# Patient Record
Sex: Female | Born: 1980 | Race: Black or African American | Hispanic: No | Marital: Single | Smoking: Current every day smoker
Health system: Southern US, Community
[De-identification: ages and names within clinical notes are randomized; demographics above are authoritative.]

## PROBLEM LIST (undated history)

## (undated) DIAGNOSIS — N809 Endometriosis, unspecified: Secondary | ICD-10-CM

## (undated) DIAGNOSIS — I10 Essential (primary) hypertension: Secondary | ICD-10-CM

## (undated) HISTORY — DX: Essential (primary) hypertension: I10

## (undated) HISTORY — PX: DILATION AND CURETTAGE OF UTERUS: SHX78

## (undated) HISTORY — DX: Endometriosis, unspecified: N80.9

## (undated) HISTORY — PX: TUBAL LIGATION: SHX77

---

## 2001-01-27 ENCOUNTER — Emergency Department (HOSPITAL_COMMUNITY): Admission: EM | Admit: 2001-01-27 | Discharge: 2001-01-27 | Payer: Self-pay | Admitting: *Deleted

## 2001-01-27 ENCOUNTER — Emergency Department (HOSPITAL_COMMUNITY): Admission: EM | Admit: 2001-01-27 | Discharge: 2001-01-27 | Payer: Self-pay | Admitting: Emergency Medicine

## 2001-05-02 ENCOUNTER — Emergency Department (HOSPITAL_COMMUNITY): Admission: EM | Admit: 2001-05-02 | Discharge: 2001-05-02 | Payer: Self-pay | Admitting: Emergency Medicine

## 2001-05-14 ENCOUNTER — Emergency Department (HOSPITAL_COMMUNITY): Admission: EM | Admit: 2001-05-14 | Discharge: 2001-05-14 | Payer: Self-pay | Admitting: *Deleted

## 2001-11-20 ENCOUNTER — Encounter: Payer: Self-pay | Admitting: Obstetrics and Gynecology

## 2001-11-20 ENCOUNTER — Emergency Department (HOSPITAL_COMMUNITY): Admission: EM | Admit: 2001-11-20 | Discharge: 2001-11-20 | Payer: Self-pay | Admitting: Emergency Medicine

## 2002-04-25 ENCOUNTER — Emergency Department (HOSPITAL_COMMUNITY): Admission: EM | Admit: 2002-04-25 | Discharge: 2002-04-26 | Payer: Self-pay | Admitting: *Deleted

## 2002-06-16 ENCOUNTER — Emergency Department (HOSPITAL_COMMUNITY): Admission: EM | Admit: 2002-06-16 | Discharge: 2002-06-16 | Payer: Self-pay | Admitting: Internal Medicine

## 2002-09-02 ENCOUNTER — Emergency Department (HOSPITAL_COMMUNITY): Admission: EM | Admit: 2002-09-02 | Discharge: 2002-09-03 | Payer: Self-pay | Admitting: *Deleted

## 2002-09-03 ENCOUNTER — Encounter: Payer: Self-pay | Admitting: *Deleted

## 2003-10-03 ENCOUNTER — Emergency Department (HOSPITAL_COMMUNITY): Admission: EM | Admit: 2003-10-03 | Discharge: 2003-10-03 | Payer: Self-pay | Admitting: Emergency Medicine

## 2004-04-10 ENCOUNTER — Emergency Department (HOSPITAL_COMMUNITY): Admission: EM | Admit: 2004-04-10 | Discharge: 2004-04-11 | Payer: Self-pay | Admitting: Emergency Medicine

## 2004-04-13 ENCOUNTER — Emergency Department (HOSPITAL_COMMUNITY): Admission: EM | Admit: 2004-04-13 | Discharge: 2004-04-13 | Payer: Self-pay | Admitting: Emergency Medicine

## 2004-07-24 ENCOUNTER — Emergency Department (HOSPITAL_COMMUNITY): Admission: EM | Admit: 2004-07-24 | Discharge: 2004-07-25 | Payer: Self-pay | Admitting: *Deleted

## 2004-12-10 ENCOUNTER — Emergency Department (HOSPITAL_COMMUNITY): Admission: EM | Admit: 2004-12-10 | Discharge: 2004-12-10 | Payer: Self-pay | Admitting: *Deleted

## 2005-01-22 ENCOUNTER — Emergency Department (HOSPITAL_COMMUNITY): Admission: EM | Admit: 2005-01-22 | Discharge: 2005-01-23 | Payer: Self-pay | Admitting: Emergency Medicine

## 2005-05-31 ENCOUNTER — Ambulatory Visit (HOSPITAL_COMMUNITY): Admission: AD | Admit: 2005-05-31 | Discharge: 2005-05-31 | Payer: Self-pay | Admitting: Obstetrics and Gynecology

## 2005-06-28 ENCOUNTER — Ambulatory Visit (HOSPITAL_COMMUNITY): Admission: AD | Admit: 2005-06-28 | Discharge: 2005-06-28 | Payer: Self-pay | Admitting: Obstetrics and Gynecology

## 2005-07-25 ENCOUNTER — Encounter: Payer: Self-pay | Admitting: Obstetrics and Gynecology

## 2005-07-25 ENCOUNTER — Inpatient Hospital Stay (HOSPITAL_COMMUNITY): Admission: RE | Admit: 2005-07-25 | Discharge: 2005-07-27 | Payer: Self-pay | Admitting: Obstetrics and Gynecology

## 2005-12-17 ENCOUNTER — Emergency Department (HOSPITAL_COMMUNITY): Admission: EM | Admit: 2005-12-17 | Discharge: 2005-12-17 | Payer: Self-pay | Admitting: Emergency Medicine

## 2007-12-02 ENCOUNTER — Emergency Department (HOSPITAL_COMMUNITY): Admission: EM | Admit: 2007-12-02 | Discharge: 2007-12-02 | Payer: Self-pay | Admitting: Emergency Medicine

## 2010-10-12 NOTE — Discharge Summary (Signed)
[name], [name]           ACCOUNT NO.:  [id number]   MEDICAL RECORD NO.:  [id number]          PATIENT TYPE:  INP   LOCATION:  A413                          FACILITY:  [facility]   PHYSICIAN:  Tilda Burrow, M.D. DATE OF BIRTH:  [birth year]   DATE OF ADMISSION:  07/25/2005  DATE OF DISCHARGE:  03/03/2007LH                                 DISCHARGE SUMMARY   ADMITTING DIAGNOSES:  1.  Pregnancy at 39+ weeks gestation.  2.  Repeat cesarean section, not for trial of labor.  3.  Elective permanent sterilization.   POSTOPERATIVE DIAGNOSES:  1.  Pregnancy at 39+ weeks gestation.  2.  Repeat cesarean section, not for trial of labor.  3.  Elective permanent sterilization.   PROCEDURE:  03/102/007, repeat low transverse cervical cesarean section,  bilateral partial salpingectomy.   DISCHARGE MEDICATIONS:  1.  Tylox #30 tablets, one q.4h. p.r.n. pain.  2.  Chromagen Forte #60 tablets, one twice daily x 30 days.   FOLLOWUP:  Follow up in one week for staple removal.   HOSPITAL SUMMARY:  This 30 year old female, gravida 4, para 1, AB 2, was  admitted at 39+ weeks gestation for repeat cesarean section.   HOSPITAL COURSE:  She underwent a repeat cesarean section and tubal ligation  on 07/25/2005.  She underwent repeat cesarean section delivering a healthy  infant, Apgars 8 and 9, with 500 to 600 cc of blood loss.  Tubal  sterilization was performed, as well.  The pathology report confirmed  presence of portions of both tubes were removed.   Postoperative laboratory assessment includes hemoglobin 10.5, hematocrit  31.7 compared to preop hemoglobin of 11.3 and hematocrit 33.5.  Maternal  blood type confirmed as O positive.   The patient had a straightforward postoperative course.  She tolerated  regular diet and had prescription for Tylox written for discharge.  She was  seen by Dr. Lisette Grinder the second postoperative day prior to discharge and  confirmed as well being for followup in  one week in our office to discuss  circumcision and remove staples.      Tilda Burrow, M.D.  Electronically Signed     JVF/MEDQ  D:  09/22/2005  T:  09/23/2005  Job:  [id number]

## 2010-10-12 NOTE — H&P (Signed)
[name], [name]           ACCOUNT NO.:  [id number]   MEDICAL RECORD NO.:  [id number]          PATIENT TYPE:  AMB   LOCATION:  DAY                           FACILITY:  [facility]   PHYSICIAN:  Tilda Burrow, M.D. DATE OF BIRTH:  Jun 03, 1980   DATE OF ADMISSION:  07/25/2005  DATE OF DISCHARGE:  LH                                HISTORY & PHYSICAL   ADMISSION DIAGNOSES:  1.  Pregnancy at 39+ weeks gestation.  2.  Repeat cesarean section, not for trial of labor.  3.  Elective permanent sterilization.   HISTORY OF PRESENT ILLNESS:  This 30 year old gravida 4, para 1, AB 2, 1  living child currently at 39+ weeks gestation is admitted after pregnancy  course was followed through our office through 13 prenatal visits.  She  desires repeat cesarean section and tubal ligation.   PRENATAL LABORATORY DATA:  Include blood type A positive.  Urine drug screen  negative.  Rubella immunity present.  Hemoglobin 11, hematocrit 35.  Hepatitis, HIV, RPR, GC, and chlamydia all negative.  She has HSV-2 antibody  status positive with patient declining MSAFP at initiation of pregnancy.   She desires to proceed with cesarean delivery.  Father of the baby, [name].  This is his first child.   PAST MEDICAL HISTORY:  Benign.   PAST SURGICAL HISTORY:  1.  D&C.  2.  Cesarean section.   ALLERGIES:  None.   SOCIAL HISTORY:  Works at [employer]. [name].  Lives with daughter.   PHYSICAL EXAMINATION:  VITAL SIGNS:  Height 5 feet 5 inches, 198 pounds.  Blood pressure 98/50.  GENERAL: Healthy female.  PELVIC: Vertex presentation with well-healed Pfannenstiel incision.   PLAN:  Repeat caesarean section and tubal ligation on July 25, 2005.      Tilda Burrow, M.D.  Electronically Signed     JVF/MEDQ  D:  07/24/2005  T:  07/24/2005  Job:  [id number]   cc:   [facility]   [name]. Savannah Cage DO, FAAP  Fax: [phone]

## 2010-10-12 NOTE — Op Note (Signed)
NAMESUBRENA, DEVEREUX           ACCOUNT NO.:  [id number]   MEDICAL RECORD NO.:  [id number]          PATIENT TYPE:  INP   LOCATION:  A413                          FACILITY:  [facility]   PHYSICIAN:  Tilda Burrow, M.D. DATE OF BIRTH:  [birth year]   DATE OF PROCEDURE:  07/25/2005  DATE OF DISCHARGE:                                 OPERATIVE REPORT   PREOPERATIVE DIAGNOSIS:  Pregnancy, 39 weeks. Repeat Cesarean section. Not  for trial of labor. Elective permanent sterilization.   POSTOPERATIVE DIAGNOSIS:  Pregnancy, 39 weeks. Repeat Cesarean section. Not  for trial of labor. Elective permanent sterilization.   PROCEDURE:  Laparoscopic tubal sterilization with Falope rings.   SURGEON:  Tilda Burrow, M.D.   ASSISTANT:  Morrie Sheldon, R.N.   ANESTHESIA:  Spinal, Nelda Severe, C.R.N.A.   COMPLICATIONS:  None.   FINDINGS:  Healthy female infant, Apgars 9 and 9,   DESCRIPTION OF PROCEDURE:  The patient was taken to the operating room,  prepped and draped for lower abdominal surgery with a Foley catheter in  place. Lower abdominal incision was repeated with excision of the cicatrix  and adjacent 1 cm skin on either side. Standard Pfannenstiel peritoneal  cavity opening was performed. Some omental adhesions of the anterior  abdominal wall were identified and resected.   Bladder was placed to retract the bladder inferiorly, bladder flap easily  developed on the lower uterine segment with transverse uterine incision in  the lower uterine segment performed, extended laterally using index finger  traction. Fetal vertex was guided out using vacuum extractor guidance  combined with fundal pressure. Infant cried promptly and was placed in the  care of Dr. Milford Cage; see his notes for further details. Apgars 8 and 9 were  assigned.   Cord blood samples were obtained and placenta delivered from its fundal  position with crede massage to the uterus. EBL very low, no more than 500 to  600 cc. The uterus was  irrigated with antibiotic solution, closed in single  layer of running locking closure with 2-0 chromic closure of the bladder  flap.   Tubal ligation:  Tubal ligation performed by grasping each tube in the mid  segment, doubly ligating around the mid segment knuckle of tube, excising  the incarcerated portion of tube as a surgical specimen and then confirming  hemostasis. Anterior peritoneum was closed with continuous running 2-0  chromic. Rectus muscles pulled together  with interrupted 2-0 chromic. The fascia was closed with running 0 Vicryl.  Subcutaneous tissues were mobilized inferiorly to allow good skin edge  reapproximation. Two-0 plain was used to reapproximated subcu fatty tissues.  Staple closure of the skin completed the procedure. EBL as listed  previously; I think it was 600.      Tilda Burrow, M.D.  Electronically Signed     JVF/MEDQ  D:  07/25/2005  T:  07/25/2005  Job:  [id number]   cc:   Francoise Schaumann. Milford Cage DO, FAAP  Fax: [phone]

## 2010-10-12 NOTE — Consult Note (Signed)
[name], [name]           ACCOUNT NO.:  [id number]   MEDICAL RECORD NO.:  [id number]          PATIENT TYPE:  OIB   LOCATION:  A415                          FACILITY:  [facility]   PHYSICIAN:  Tilda Burrow, M.D. DATE OF BIRTH:  Dec 14, 1980   DATE OF CONSULTATION:  DATE OF DISCHARGE:  05/31/2005                                   CONSULTATION   OBSERVATION NOTE:   CHIEF COMPLAINT:  Sharp stomach pains.   HISTORY OF PRESENT ILLNESS:  This [redacted] weeks gestation multiparous patient  presents at approximately 4 p.m. complaining of stomach pain and pressure  alert, awake, oriented x3.  When at work she notices abdominal discomfort.  She additionally has a history of nausea and vomiting in the mornings.  She  feels pressure sensation after eating and eating and is unable to sleep due  to the heavy stomach.   PHYSICAL EXAMINATION:  Assessment includes a blood pressure 115/55, pulse  100, no evidence of bleeding.  Reflexes 2+.  The patient is evaluated at  bedside by Jannifer Franklin.   IMPRESSION:  Clinical impression is that the patient simply has pelvic  pressure related to third trimester.  She is therefore discharged home for  followup June 03, 2005 in the office.  She is given a note for work and  symptoms of preterm labor reviewed.   Observation time less than 1 hour.      Tilda Burrow, M.D.  Electronically Signed     JVF/MEDQ  D:  06/26/2005  T:  06/26/2005  Job:  [id number]

## 2011-02-21 LAB — POCT I-STAT, CHEM 8
Chloride: 106
Glucose, Bld: 91
HCT: 43
Hemoglobin: 14.6
Potassium: 4.2
Sodium: 137

## 2011-03-24 ENCOUNTER — Emergency Department (HOSPITAL_COMMUNITY): Payer: Medicaid Other

## 2011-03-24 ENCOUNTER — Emergency Department (HOSPITAL_COMMUNITY)
Admission: EM | Admit: 2011-03-24 | Discharge: 2011-03-24 | Disposition: A | Discharge status: Home / Self Care | Payer: Medicaid Other | Attending: Emergency Medicine | Admitting: Emergency Medicine

## 2011-03-24 DIAGNOSIS — R079 Chest pain, unspecified: Secondary | ICD-10-CM | POA: Insufficient documentation

## 2011-03-24 DIAGNOSIS — S20219A Contusion of unspecified front wall of thorax, initial encounter: Secondary | ICD-10-CM | POA: Insufficient documentation

## 2011-04-10 ENCOUNTER — Emergency Department (INDEPENDENT_AMBULATORY_CARE_PROVIDER_SITE_OTHER)
Admission: EM | Admit: 2011-04-10 | Discharge: 2011-04-10 | Disposition: A | Discharge status: Home / Self Care | Payer: Medicaid Other | Source: Home / Self Care | Attending: Family Medicine | Admitting: Family Medicine

## 2011-04-10 ENCOUNTER — Encounter: Payer: Self-pay | Admitting: *Deleted

## 2011-04-10 DIAGNOSIS — K089 Disorder of teeth and supporting structures, unspecified: Secondary | ICD-10-CM

## 2011-04-10 DIAGNOSIS — K0889 Other specified disorders of teeth and supporting structures: Secondary | ICD-10-CM

## 2011-04-10 MED ORDER — DICLOFENAC POTASSIUM 50 MG PO TABS: 50.0000 mg | ORAL_TABLET | Freq: Three times a day (TID) | ORAL | Status: DC

## 2011-04-10 NOTE — ED Provider Notes (Signed)
History     CSN: [id number] Arrival date & time: 04/10/2011  4:27 PM   First MD Initiated Contact with Patient 04/10/11 1651      Chief Complaint  Patient presents with  . Dental Pain    (Consider location/radiation/quality/duration/timing/severity/associated sxs/prior treatment) Patient is a 30 y.o. female presenting with tooth pain. The history is provided by the patient.  Dental PainThe symptoms began more than 1 week ago (s/p wisdom teeth extraction on 11/6 , took all of abx prescribed.. has appt on thurs.). The symptoms are unchanged.  Additional symptoms include: facial swelling. Additional symptoms do not include: trouble swallowing.    History reviewed. No pertinent past medical history.  Past Surgical History  Procedure Date  . Dilation and curettage of uterus     History reviewed. No pertinent family history.  History  Substance Use Topics  . Smoking status: Current Everyday Smoker  . Smokeless tobacco: Not on file  . Alcohol Use: No    OB History    Grav Para Term Preterm Abortions TAB SAB Ect Mult Living                  Review of Systems  Constitutional: Negative.   HENT: Positive for facial swelling and dental problem. Negative for trouble swallowing and voice change.     Allergies  Review of patient's allergies indicates no known allergies.  Home Medications   Current Outpatient Rx  Name Route Sig Dispense Refill  . IBUPROFEN 200 MG PO TABS Oral Take 200 mg by mouth every 6 (six) hours as needed.        BP 129/80  Pulse 85  Temp(Src) 98.3 F (36.8 C) (Oral)  Resp 18  SpO2 98%  LMP 02/24/2011  Physical Exam  Constitutional: She appears well-developed and well-nourished.  HENT:  Head: Normocephalic and atraumatic.  Right Ear: External ear normal.  Left Ear: External ear normal.  Nose: Nose normal.  Mouth/Throat: Uvula is midline, oropharynx is clear and moist and mucous membranes are normal. Abnormal dentition. Dental abscesses and  dental caries present. No uvula swelling.  Neck: Normal range of motion. Neck supple.  Lymphadenopathy:    She has no cervical adenopathy.    ED Course  Procedures (including critical care time)  Labs Reviewed - No data to display No results found.   No diagnosis found.    MDM          Barkley Bruns, MD 04/10/11 [phone]

## 2011-04-10 NOTE — ED Notes (Signed)
Toothache   -  Had  Teeth  Extracted      6  Nov    Has  An appt  With  Dentist  tommorow  Pain not  releived  By  tylenol

## 2011-12-04 ENCOUNTER — Encounter (HOSPITAL_COMMUNITY): Payer: Self-pay | Admitting: Emergency Medicine

## 2011-12-04 LAB — CBC WITH DIFFERENTIAL/PLATELET
Eosinophils Relative: 2 % (ref 0–5)
HCT: 39.5 % (ref 36.0–46.0)
Lymphocytes Relative: 29 % (ref 12–46)
Lymphs Abs: 4.1 10*3/uL — ABNORMAL HIGH (ref 0.7–4.0)
MCHC: 33.7 g/dL (ref 30.0–36.0)
Monocytes Absolute: 0.9 10*3/uL (ref 0.1–1.0)
Monocytes Relative: 6 % (ref 3–12)
Platelets: 262 10*3/uL (ref 150–400)
RBC: 4.54 MIL/uL (ref 3.87–5.11)
RDW: 13.7 % (ref 11.5–15.5)

## 2011-12-04 LAB — URINE MICROSCOPIC-ADD ON

## 2011-12-04 LAB — URINALYSIS, ROUTINE W REFLEX MICROSCOPIC
Glucose, UA: NEGATIVE mg/dL
Ketones, ur: NEGATIVE mg/dL
Nitrite: NEGATIVE
Protein, ur: NEGATIVE mg/dL

## 2011-12-04 MED ORDER — ONDANSETRON 4 MG PO TBDP
ORAL_TABLET | ORAL | Status: AC
  Filled 2011-12-04: qty 1

## 2011-12-04 MED ORDER — ONDANSETRON 4 MG PO TBDP
4.0000 mg | ORAL_TABLET | Freq: Once | ORAL | Status: AC
  Administered 2011-12-04: 4 mg via ORAL

## 2011-12-04 NOTE — ED Notes (Signed)
Pt continues with dry heaves

## 2011-12-04 NOTE — ED Notes (Addendum)
Error in charting.

## 2011-12-04 NOTE — ED Notes (Signed)
Pt stating she is going to leave b/c the pain is too much.  Charge RN notified and stated we are unable to open a room for her at this time.

## 2011-12-04 NOTE — ED Notes (Signed)
The pt is c/o epigastric pain with nausea  No diarrhea.  Urinary frequency with  Bloody urine lmp  3 months ago  Tubal lig

## 2011-12-04 NOTE — ED Notes (Addendum)
Disregard all charting by EP: charted under wrong pt. Zofran not given to this pt

## 2011-12-04 NOTE — ED Notes (Signed)
Pt states she is leaving.  Encouraged to stay.  Stating she is leaving.

## 2011-12-05 ENCOUNTER — Inpatient Hospital Stay (HOSPITAL_COMMUNITY): Payer: Medicaid Other | Admitting: Anesthesiology

## 2011-12-05 ENCOUNTER — Inpatient Hospital Stay (HOSPITAL_COMMUNITY)
Admission: EM | Admit: 2011-12-05 | Discharge: 2011-12-06 | DRG: 340 | Disposition: A | Discharge status: Home / Self Care | Payer: Medicaid Other | Attending: Surgery | Admitting: Surgery

## 2011-12-05 ENCOUNTER — Emergency Department (HOSPITAL_COMMUNITY)
Admission: EM | Admit: 2011-12-05 | Discharge: 2011-12-05 | Disposition: A | Discharge status: Home / Self Care | Payer: Medicaid Other | Source: Home / Self Care

## 2011-12-05 ENCOUNTER — Inpatient Hospital Stay: Admit: 2011-12-05 | Payer: Self-pay | Admitting: Surgery

## 2011-12-05 ENCOUNTER — Emergency Department (HOSPITAL_COMMUNITY): Payer: Medicaid Other

## 2011-12-05 ENCOUNTER — Encounter (HOSPITAL_COMMUNITY)
Admission: EM | Disposition: A | Discharge status: Home / Self Care | Payer: Self-pay | Source: Home / Self Care | Attending: Surgery

## 2011-12-05 ENCOUNTER — Encounter (HOSPITAL_COMMUNITY): Payer: Self-pay

## 2011-12-05 ENCOUNTER — Encounter (HOSPITAL_COMMUNITY): Payer: Self-pay | Admitting: Anesthesiology

## 2011-12-05 DIAGNOSIS — K3580 Unspecified acute appendicitis: Secondary | ICD-10-CM | POA: Diagnosis present

## 2011-12-05 DIAGNOSIS — K3533 Acute appendicitis with perforation and localized peritonitis, with abscess: Principal | ICD-10-CM | POA: Diagnosis present

## 2011-12-05 HISTORY — PX: LAPAROSCOPIC APPENDECTOMY: SHX408

## 2011-12-05 HISTORY — DX: Essential (primary) hypertension: I10

## 2011-12-05 LAB — COMPREHENSIVE METABOLIC PANEL
ALT: 27 U/L (ref 0–35)
AST: 17 U/L (ref 0–37)
Albumin: 4.1 g/dL (ref 3.5–5.2)
Albumin: 4.4 g/dL (ref 3.5–5.2)
Alkaline Phosphatase: 67 U/L (ref 39–117)
BUN: 10 mg/dL (ref 6–23)
CO2: 21 mEq/L (ref 19–32)
Calcium: 9.3 mg/dL (ref 8.4–10.5)
Calcium: 9.6 mg/dL (ref 8.4–10.5)
Chloride: 102 mEq/L (ref 96–112)
GFR calc Af Amer: >90 mL/min (ref >90)
GFR calc non Af Amer: >90 mL/min (ref >90)
Glucose, Bld: 91 mg/dL (ref 70–99)
Potassium: 3.2 mEq/L — ABNORMAL LOW (ref 3.5–5.1)
Sodium: 139 mEq/L (ref 135–145)
Total Bilirubin: 0.3 mg/dL (ref 0.3–1.2)
Total Bilirubin: 0.4 mg/dL (ref 0.3–1.2)
Total Protein: 7.7 g/dL (ref 6.0–8.3)
Total Protein: 8 g/dL (ref 6.0–8.3)

## 2011-12-05 LAB — URINE MICROSCOPIC-ADD ON

## 2011-12-05 LAB — CBC
Hemoglobin: 13.7 g/dL (ref 12.0–15.0)
Platelets: 301 10*3/uL (ref 150–400)
RBC: 4.72 MIL/uL (ref 3.87–5.11)
RDW: 13.3 % (ref 11.5–15.5)
WBC: 14.7 10*3/uL — ABNORMAL HIGH (ref 4.0–10.5)

## 2011-12-05 LAB — URINALYSIS, ROUTINE W REFLEX MICROSCOPIC
Bilirubin Urine: NEGATIVE
Hgb urine dipstick: NEGATIVE
Nitrite: NEGATIVE
Specific Gravity, Urine: 1.027 (ref 1.005–1.030)
pH: 7.5 (ref 5.0–8.0)

## 2011-12-05 LAB — PREGNANCY, URINE: Preg Test, Ur: NEGATIVE

## 2011-12-05 SURGERY — APPENDECTOMY, LAPAROSCOPIC
Anesthesia: General | Site: Abdomen | Wound class: Dirty or Infected

## 2011-12-05 MED ORDER — LACTATED RINGERS IV SOLN
INTRAVENOUS | Status: DC | PRN
  Administered 2011-12-05 (×2): via INTRAVENOUS

## 2011-12-05 MED ORDER — FENTANYL CITRATE 0.05 MG/ML IJ SOLN
INTRAMUSCULAR | Status: AC
  Filled 2011-12-05: qty 2

## 2011-12-05 MED ORDER — DIPHENHYDRAMINE HCL 50 MG/ML IJ SOLN: 12.5000 mg | Freq: Four times a day (QID) | INTRAMUSCULAR | Status: DC | PRN

## 2011-12-05 MED ORDER — MORPHINE SULFATE 4 MG/ML IJ SOLN
4.0000 mg | Freq: Once | INTRAMUSCULAR | Status: AC
  Administered 2011-12-05: 4 mg via INTRAVENOUS
  Filled 2011-12-05: qty 1

## 2011-12-05 MED ORDER — MORPHINE SULFATE 2 MG/ML IJ SOLN
1.0000 mg | INTRAMUSCULAR | Status: DC | PRN
  Administered 2011-12-05: 4 mg via INTRAVENOUS
  Administered 2011-12-05 – 2011-12-06 (×3): 2 mg via INTRAVENOUS
  Filled 2011-12-05: qty 2
  Filled 2011-12-05 (×4): qty 1

## 2011-12-05 MED ORDER — LACTATED RINGERS IV SOLN
INTRAVENOUS | Status: DC
  Administered 2011-12-05 – 2011-12-06 (×3): via INTRAVENOUS

## 2011-12-05 MED ORDER — 0.9 % SODIUM CHLORIDE (POUR BTL) OPTIME
TOPICAL | Status: DC | PRN
  Administered 2011-12-05: 1000 mL

## 2011-12-05 MED ORDER — AMOXICILLIN-POT CLAVULANATE 875-125 MG PO TABS: 1.0000 | ORAL_TABLET | Freq: Two times a day (BID) | ORAL | Status: DC

## 2011-12-05 MED ORDER — SODIUM CHLORIDE 0.9 % IJ SOLN
3.0000 mL | Freq: Two times a day (BID) | INTRAMUSCULAR | Status: DC
  Administered 2011-12-05 – 2011-12-06 (×2): 3 mL via INTRAVENOUS

## 2011-12-05 MED ORDER — METRONIDAZOLE IN NACL 5-0.79 MG/ML-% IV SOLN
500.0000 mg | Freq: Three times a day (TID) | INTRAVENOUS | Status: DC
  Administered 2011-12-05 – 2011-12-06 (×4): 500 mg via INTRAVENOUS
  Filled 2011-12-05 (×4): qty 100

## 2011-12-05 MED ORDER — ACETAMINOPHEN 650 MG RE SUPP: 650.0000 mg | Freq: Four times a day (QID) | RECTAL | Status: DC | PRN

## 2011-12-05 MED ORDER — NAPROXEN 500 MG PO TABS
500.0000 mg | ORAL_TABLET | Freq: Two times a day (BID) | ORAL | Status: DC
  Administered 2011-12-05 – 2011-12-06 (×2): 500 mg via ORAL
  Filled 2011-12-05 (×4): qty 1

## 2011-12-05 MED ORDER — ONDANSETRON HCL 4 MG/2ML IJ SOLN
INTRAMUSCULAR | Status: DC | PRN
  Administered 2011-12-05: 4 mg via INTRAVENOUS

## 2011-12-05 MED ORDER — LIP MEDEX EX OINT
1.0000 "application " | TOPICAL_OINTMENT | Freq: Two times a day (BID) | CUTANEOUS | Status: DC
  Administered 2011-12-05 – 2011-12-06 (×2): 1 via TOPICAL
  Filled 2011-12-05: qty 7

## 2011-12-05 MED ORDER — DEXAMETHASONE SODIUM PHOSPHATE 10 MG/ML IJ SOLN
INTRAMUSCULAR | Status: DC | PRN
  Administered 2011-12-05: 10 mg via INTRAVENOUS

## 2011-12-05 MED ORDER — NEOSTIGMINE METHYLSULFATE 1 MG/ML IJ SOLN
INTRAMUSCULAR | Status: DC | PRN
  Administered 2011-12-05: 3 mg via INTRAVENOUS

## 2011-12-05 MED ORDER — MORPHINE SULFATE 4 MG/ML IJ SOLN
6.0000 mg | Freq: Once | INTRAMUSCULAR | Status: AC
  Administered 2011-12-05: 6 mg via INTRAVENOUS
  Filled 2011-12-05: qty 2

## 2011-12-05 MED ORDER — METRONIDAZOLE IN NACL 5-0.79 MG/ML-% IV SOLN
INTRAVENOUS | Status: AC
  Filled 2011-12-05: qty 100

## 2011-12-05 MED ORDER — BUPIVACAINE-EPINEPHRINE PF 0.25-1:200000 % IJ SOLN
INTRAMUSCULAR | Status: DC | PRN
  Administered 2011-12-05: 60 mL

## 2011-12-05 MED ORDER — BISACODYL 10 MG RE SUPP: 10.0000 mg | Freq: Two times a day (BID) | RECTAL | Status: DC | PRN

## 2011-12-05 MED ORDER — OXYCODONE HCL 5 MG PO TABS: 5.0000 mg | ORAL_TABLET | ORAL | Status: AC | PRN

## 2011-12-05 MED ORDER — LACTATED RINGERS IV BOLUS (SEPSIS): 1000.0000 mL | Freq: Three times a day (TID) | INTRAVENOUS | Status: DC | PRN

## 2011-12-05 MED ORDER — SUCCINYLCHOLINE CHLORIDE 20 MG/ML IJ SOLN
INTRAMUSCULAR | Status: DC | PRN
  Administered 2011-12-05: 100 mg via INTRAVENOUS

## 2011-12-05 MED ORDER — KETOROLAC TROMETHAMINE 30 MG/ML IJ SOLN
INTRAMUSCULAR | Status: DC | PRN
  Administered 2011-12-05: 30 mg via INTRAVENOUS

## 2011-12-05 MED ORDER — ACETAMINOPHEN 10 MG/ML IV SOLN
INTRAVENOUS | Status: AC
  Filled 2011-12-05: qty 100

## 2011-12-05 MED ORDER — IOHEXOL 300 MG/ML  SOLN
100.0000 mL | Freq: Once | INTRAMUSCULAR | Status: AC | PRN
  Administered 2011-12-05: 100 mL via INTRAVENOUS

## 2011-12-05 MED ORDER — MAGNESIUM HYDROXIDE 400 MG/5ML PO SUSP: 30.0000 mL | Freq: Two times a day (BID) | ORAL | Status: DC | PRN

## 2011-12-05 MED ORDER — PROMETHAZINE HCL 25 MG/ML IJ SOLN: 12.5000 mg | Freq: Four times a day (QID) | INTRAMUSCULAR | Status: DC | PRN

## 2011-12-05 MED ORDER — SUFENTANIL CITRATE 50 MCG/ML IV SOLN
INTRAVENOUS | Status: DC | PRN
  Administered 2011-12-05: 10 ug via INTRAVENOUS
  Administered 2011-12-05: 5 ug via INTRAVENOUS
  Administered 2011-12-05: 10 ug via INTRAVENOUS
  Administered 2011-12-05: 5 ug via INTRAVENOUS

## 2011-12-05 MED ORDER — GLYCOPYRROLATE 0.2 MG/ML IJ SOLN
INTRAMUSCULAR | Status: DC | PRN
  Administered 2011-12-05: 0.6 mg via INTRAVENOUS

## 2011-12-05 MED ORDER — HYDROMORPHONE HCL PF 1 MG/ML IJ SOLN: 0.2500 mg | INTRAMUSCULAR | Status: DC | PRN

## 2011-12-05 MED ORDER — HYDROCODONE-ACETAMINOPHEN 5-325 MG PO TABS
1.0000 | ORAL_TABLET | ORAL | Status: DC | PRN
  Administered 2011-12-05 – 2011-12-06 (×4): 2 via ORAL
  Filled 2011-12-05 (×4): qty 2

## 2011-12-05 MED ORDER — POTASSIUM CHLORIDE 2 MEQ/ML IV SOLN
INTRAVENOUS | Status: DC
  Administered 2011-12-05: 12:00:00 via INTRAVENOUS
  Filled 2011-12-05 (×2): qty 1000

## 2011-12-05 MED ORDER — STERILE WATER FOR IRRIGATION IR SOLN
Status: DC | PRN
  Administered 2011-12-05: 1500 mL

## 2011-12-05 MED ORDER — FENTANYL CITRATE 0.05 MG/ML IJ SOLN
50.0000 ug | INTRAMUSCULAR | Status: AC
  Administered 2011-12-05: 50 ug via INTRAVENOUS
  Filled 2011-12-05: qty 2

## 2011-12-05 MED ORDER — LIDOCAINE HCL (CARDIAC) 20 MG/ML IV SOLN
INTRAVENOUS | Status: DC | PRN
  Administered 2011-12-05: 50 mg via INTRAVENOUS

## 2011-12-05 MED ORDER — SACCHAROMYCES BOULARDII 250 MG PO CAPS
250.0000 mg | ORAL_CAPSULE | Freq: Two times a day (BID) | ORAL | Status: DC
  Administered 2011-12-05 – 2011-12-06 (×2): 250 mg via ORAL
  Filled 2011-12-05 (×3): qty 1

## 2011-12-05 MED ORDER — FENTANYL CITRATE 0.05 MG/ML IJ SOLN
INTRAMUSCULAR | Status: DC | PRN
  Administered 2011-12-05 (×2): 50 ug via INTRAVENOUS

## 2011-12-05 MED ORDER — ONDANSETRON HCL 4 MG/2ML IJ SOLN
4.0000 mg | Freq: Once | INTRAMUSCULAR | Status: AC
  Administered 2011-12-05: 4 mg via INTRAVENOUS
  Filled 2011-12-05: qty 2

## 2011-12-05 MED ORDER — CEFTRIAXONE SODIUM 2 G IJ SOLR
2.0000 g | INTRAMUSCULAR | Status: DC
  Administered 2011-12-05 – 2011-12-06 (×2): 2 g via INTRAVENOUS
  Filled 2011-12-05 (×2): qty 2

## 2011-12-05 MED ORDER — ONDANSETRON HCL 4 MG/2ML IJ SOLN: 4.0000 mg | Freq: Four times a day (QID) | INTRAMUSCULAR | Status: DC | PRN

## 2011-12-05 MED ORDER — ACETAMINOPHEN 325 MG PO TABS
325.0000 mg | ORAL_TABLET | Freq: Four times a day (QID) | ORAL | Status: DC | PRN
  Administered 2011-12-06: 650 mg via ORAL
  Filled 2011-12-05: qty 2

## 2011-12-05 MED ORDER — METOCLOPRAMIDE HCL 5 MG/ML IJ SOLN
INTRAMUSCULAR | Status: DC | PRN
  Administered 2011-12-05 (×2): 5 mg via INTRAVENOUS

## 2011-12-05 MED ORDER — MAGIC MOUTHWASH
15.0000 mL | Freq: Four times a day (QID) | ORAL | Status: DC | PRN
  Filled 2011-12-05: qty 15

## 2011-12-05 MED ORDER — ACETAMINOPHEN 325 MG PO TABS: 650.0000 mg | ORAL_TABLET | Freq: Four times a day (QID) | ORAL | Status: DC | PRN

## 2011-12-05 MED ORDER — ROCURONIUM BROMIDE 100 MG/10ML IV SOLN
INTRAVENOUS | Status: DC | PRN
  Administered 2011-12-05: 40 mg via INTRAVENOUS

## 2011-12-05 MED ORDER — ALUM & MAG HYDROXIDE-SIMETH 200-200-20 MG/5ML PO SUSP: 30.0000 mL | Freq: Four times a day (QID) | ORAL | Status: DC | PRN

## 2011-12-05 MED ORDER — SODIUM CHLORIDE 0.9 % IJ SOLN: 3.0000 mL | INTRAMUSCULAR | Status: DC | PRN

## 2011-12-05 MED ORDER — PROPOFOL 10 MG/ML IV BOLUS
INTRAVENOUS | Status: DC | PRN
  Administered 2011-12-05: 100 mg via INTRAVENOUS

## 2011-12-05 MED ORDER — MIDAZOLAM HCL 5 MG/5ML IJ SOLN
INTRAMUSCULAR | Status: DC | PRN
  Administered 2011-12-05 (×2): 1 mg via INTRAVENOUS

## 2011-12-05 MED ORDER — LACTATED RINGERS IR SOLN
Status: DC | PRN
  Administered 2011-12-05: 1000 mL
  Administered 2011-12-05: 3000 mL

## 2011-12-05 MED ORDER — SODIUM CHLORIDE 0.9 % IV SOLN: 250.0000 mL | INTRAVENOUS | Status: DC | PRN

## 2011-12-05 MED ORDER — PSYLLIUM 95 % PO PACK
1.0000 | PACK | Freq: Two times a day (BID) | ORAL | Status: DC
  Administered 2011-12-05 – 2011-12-06 (×2): 1 via ORAL
  Filled 2011-12-05 (×3): qty 1

## 2011-12-05 MED ORDER — BUPIVACAINE-EPINEPHRINE PF 0.25-1:200000 % IJ SOLN
INTRAMUSCULAR | Status: AC
  Filled 2011-12-05: qty 60

## 2011-12-05 SURGICAL SUPPLY — 46 items
APPLIER CLIP 5 13 M/L LIGAMAX5 (MISCELLANEOUS)
APPLIER CLIP ROT 10 11.4 M/L (STAPLE)
CANISTER SUCTION 2500CC (MISCELLANEOUS) ×2 IMPLANT
CLIP APPLIE 5 13 M/L LIGAMAX5 (MISCELLANEOUS) IMPLANT
CLIP APPLIE ROT 10 11.4 M/L (STAPLE) IMPLANT
CLOTH BEACON ORANGE TIMEOUT ST (SAFETY) ×2 IMPLANT
CUTTER FLEX LINEAR 45M (STAPLE) IMPLANT
DECANTER SPIKE VIAL GLASS SM (MISCELLANEOUS) ×2 IMPLANT
DRAPE LAPAROSCOPIC ABDOMINAL (DRAPES) ×2 IMPLANT
DRAPE UTILITY XL STRL (DRAPES) ×2 IMPLANT
DRSG TEGADERM 2-3/8X2-3/4 SM (GAUZE/BANDAGES/DRESSINGS) ×2 IMPLANT
DRSG TEGADERM 4X4.75 (GAUZE/BANDAGES/DRESSINGS) IMPLANT
ELECT REM PT RETURN 9FT ADLT (ELECTROSURGICAL) ×2
ELECTRODE REM PT RTRN 9FT ADLT (ELECTROSURGICAL) ×1 IMPLANT
ENDOLOOP SUT PDS II  0 18 (SUTURE) ×1
ENDOLOOP SUT PDS II 0 18 (SUTURE) ×1 IMPLANT
GAUZE SPONGE 2X2 8PLY STRL LF (GAUZE/BANDAGES/DRESSINGS) ×1 IMPLANT
GLOVE BIOGEL PI IND STRL 7.0 (GLOVE) ×1 IMPLANT
GLOVE BIOGEL PI INDICATOR 7.0 (GLOVE) ×1
GLOVE ECLIPSE 8.0 STRL XLNG CF (GLOVE) ×2 IMPLANT
GLOVE INDICATOR 8.0 STRL GRN (GLOVE) ×4 IMPLANT
GOWN STRL NON-REIN LRG LVL3 (GOWN DISPOSABLE) ×2 IMPLANT
GOWN STRL REIN XL XLG (GOWN DISPOSABLE) ×4 IMPLANT
IV LACTATED RINGER IRRG 3000ML (IV SOLUTION) ×1
IV LACTATED RINGERS 1000ML (IV SOLUTION) ×2 IMPLANT
IV LR IRRIG 3000ML ARTHROMATIC (IV SOLUTION) ×1 IMPLANT
KIT BASIN OR (CUSTOM PROCEDURE TRAY) ×2 IMPLANT
NS IRRIG 1000ML POUR BTL (IV SOLUTION) ×2 IMPLANT
PENCIL BUTTON HOLSTER BLD 10FT (ELECTRODE) IMPLANT
POUCH SPECIMEN RETRIEVAL 10MM (ENDOMECHANICALS) ×2 IMPLANT
RELOAD 45 VASCULAR/THIN (ENDOMECHANICALS) IMPLANT
RELOAD BLUE (STAPLE) ×2 IMPLANT
RELOAD STAPLE TA45 3.5 REG BLU (ENDOMECHANICALS) IMPLANT
SET IRRIG TUBING LAPAROSCOPIC (IRRIGATION / IRRIGATOR) ×2 IMPLANT
SOLUTION ANTI FOG 6CC (MISCELLANEOUS) ×2 IMPLANT
SPONGE GAUZE 2X2 STER 10/PKG (GAUZE/BANDAGES/DRESSINGS) ×1
STAPLE ECHEON FLEX 60 POW ENDO (STAPLE) ×2 IMPLANT
SUT MNCRL AB 4-0 PS2 18 (SUTURE) ×2 IMPLANT
TOWEL OR 17X26 10 PK STRL BLUE (TOWEL DISPOSABLE) ×2 IMPLANT
TOWEL OR NON WOVEN STRL DISP B (DISPOSABLE) ×2 IMPLANT
TRAY FOLEY CATH 14FRSI W/METER (CATHETERS) ×2 IMPLANT
TRAY LAP CHOLE (CUSTOM PROCEDURE TRAY) ×2 IMPLANT
TROCAR XCEL BLADELESS 5X75MML (TROCAR) ×2 IMPLANT
TROCAR Z-THREAD FIOS 12X100MM (TROCAR) ×2 IMPLANT
TROCAR Z-THREAD FIOS 5X100MM (TROCAR) ×4 IMPLANT
TUBING INSUFFLATION 10FT LAP (TUBING) ×2 IMPLANT

## 2011-12-05 NOTE — ED Notes (Signed)
The pt did not get zofran another rn placed it on the wrong chart

## 2011-12-05 NOTE — ED Notes (Signed)
Report received-airway intact-no s/s's of distress-will continue to monitor 

## 2011-12-05 NOTE — Transfer of Care (Signed)
Immediate Anesthesia Transfer of Care Note  Patient: [name]  Procedure(s) Performed: Procedure(s) (LRB): APPENDECTOMY LAPAROSCOPIC (N/A)  Patient Location: PACU  Anesthesia Type: General  Level of Consciousness: awake, alert , oriented and patient cooperative  Airway & Oxygen Therapy: Patient Spontanous Breathing and Patient connected to face mask oxygen  Post-op Assessment: Report given to PACU RN, Post -op Vital signs reviewed and stable and Patient moving all extremities X 4  Post vital signs: stable  Complications: No apparent anesthesia complications

## 2011-12-05 NOTE — Anesthesia Procedure Notes (Signed)
Procedure Name: Intubation Date/Time: 12/05/2011 12:27 PM Performed by: Randon Goldsmith [name] Pre-anesthesia Checklist: Patient identified, Emergency Drugs available, Suction available and Patient being monitored Patient Re-evaluated:Patient Re-evaluated prior to inductionOxygen Delivery Method: Circle system utilized Preoxygenation: Pre-oxygenation with 100% oxygen Intubation Type: IV induction, Cricoid Pressure applied and Rapid sequence Laryngoscope Size: Mac and 4 Grade View: Grade II Tube type: Oral Tube size: 7.5 mm Number of attempts: 1 Airway Equipment and Method: Stylet Placement Confirmation: ETT inserted through vocal cords under direct vision,  positive ETCO2 and breath sounds checked- equal and bilateral Secured at: 21 cm Tube secured with: Tape Dental Injury: Teeth and Oropharynx as per pre-operative assessment

## 2011-12-05 NOTE — ED Notes (Signed)
Informed patient that she needed to call friend/family member to get child prior to surgery

## 2011-12-05 NOTE — ED Provider Notes (Signed)
History     CSN: [id number]  Arrival date & time 12/05/11  6578   First MD Initiated Contact with Patient 12/05/11 [phone]      Chief Complaint  Patient presents with  . Abdominal Pain    (Consider location/radiation/quality/duration/timing/severity/associated sxs/prior treatment) Patient is a 31 y.o. female presenting with abdominal pain. The history is provided by the patient.  Abdominal Pain The primary symptoms of the illness include abdominal pain and nausea. The primary symptoms of the illness do not include fever, vomiting, diarrhea, dysuria, vaginal discharge or vaginal bleeding. The current episode started yesterday. The onset of the illness was gradual. The problem has been gradually worsening.  The patient states that she believes she is currently not pregnant. Additional symptoms associated with the illness include chills and anorexia. Symptoms associated with the illness do not include constipation, urgency, hematuria, frequency or back pain.  Pt reports severe diffuse abdominal pain that began yesterday, states went to [facility] ER, states the wait was long, she thought maybe pain would improve so went home. This morning, pain is worsening. States nausea, no vomiting. No diarrhea. No prior hx of the same. Took ibuprofen with no relief.   Past Medical History  Diagnosis Date  . Diabetes mellitus   . Hypertension     Past Surgical History  Procedure Date  . Dilation and curettage of uterus     No family history on file.  History  Substance Use Topics  . Smoking status: Current Everyday Smoker  . Smokeless tobacco: Not on file  . Alcohol Use: No    OB History    Grav Para Term Preterm Abortions TAB SAB Ect Mult Living                  Review of Systems  Constitutional: Positive for chills. Negative for fever.  HENT: Negative for neck pain and neck stiffness.   Respiratory: Negative.   Cardiovascular: Negative.   Gastrointestinal: Positive for nausea, abdominal  pain and anorexia. Negative for vomiting, diarrhea and constipation.  Genitourinary: Negative for dysuria, urgency, frequency, hematuria, vaginal bleeding and vaginal discharge.  Musculoskeletal: Negative for back pain.  Skin: Negative.   Neurological: Positive for weakness. Negative for dizziness and headaches.  Psychiatric/Behavioral: Negative.     Allergies  Review of patient's allergies indicates not on file.  Home Medications   Current Outpatient Rx  Name Route Sig Dispense Refill  . IBUPROFEN 200 MG PO TABS Oral Take 200 mg by mouth every 6 (six) hours as needed.        BP 116/70  Temp 98.7 F (37.1 C) (Oral)  Resp 25  SpO2 99%  LMP 09/04/2011  Physical Exam  Nursing note and vitals reviewed. Constitutional: She is oriented to person, place, and time. She appears well-developed and well-nourished.       Appears uncomfortable  HENT:  Head: Normocephalic.  Eyes: Conjunctivae are normal.  Cardiovascular: Normal rate and regular rhythm.   Pulmonary/Chest: Effort normal and breath sounds normal. No respiratory distress. She has no wheezes. She has no rales.  Abdominal: Soft. Bowel sounds are normal. She exhibits no distension. There is tenderness. There is no rebound.       Diffuse tenderness, with guarding in RLQ  Musculoskeletal: Normal range of motion. She exhibits no edema.  Neurological: She is alert and oriented to person, place, and time.  Skin: Skin is warm and dry.  Psychiatric: She has a normal mood and affect.    ED Course  Procedures (  including critical care time)  Pt with abdominal pain, nausea. Elevated WBC, tender in RLQ.  Results for orders placed during the hospital encounter of 12/05/11  CBC      Component Value Range   WBC 14.7 (*) 4.0 - 10.5 K/uL   RBC 4.72  3.87 - 5.11 MIL/uL   Hemoglobin 13.7  12.0 - 15.0 g/dL   HCT 16.1  09.6 - 04.5 %   MCV 86.9  78.0 - 100.0 fL   MCH 29.0  26.0 - 34.0 pg   MCHC 33.4  30.0 - 36.0 g/dL   RDW 40.9  81.1 -  91.4 %   Platelets 301  150 - 400 K/uL  COMPREHENSIVE METABOLIC PANEL      Component Value Range   Sodium 136  135 - 145 mEq/L   Potassium 3.5  3.5 - 5.1 mEq/L   Chloride 102  96 - 112 mEq/L   CO2 21  19 - 32 mEq/L   Glucose, Bld 93  70 - 99 mg/dL   BUN 10  6 - 23 mg/dL   Creatinine, Ser 7.82  0.50 - 1.10 mg/dL   Calcium 9.6  8.4 - 95.6 mg/dL   Total Protein 8.0  6.0 - 8.3 g/dL   Albumin 4.4  3.5 - 5.2 g/dL   AST 16  0 - 37 U/L   ALT 27  0 - 35 U/L   Alkaline Phosphatase 72  39 - 117 U/L   Total Bilirubin 0.4  0.3 - 1.2 mg/dL   GFR calc non Af Amer >90  >90 mL/min   GFR calc Af Amer >90  >90 mL/min  LIPASE, BLOOD      Component Value Range   Lipase 17  11 - 59 U/L  URINALYSIS, ROUTINE W REFLEX MICROSCOPIC      Component Value Range   Color, Urine YELLOW  YELLOW   APPearance CLEAR  CLEAR   Specific Gravity, Urine 1.027  1.005 - 1.030   pH 7.5  5.0 - 8.0   Glucose, UA NEGATIVE  NEGATIVE mg/dL   Hgb urine dipstick NEGATIVE  NEGATIVE   Bilirubin Urine NEGATIVE  NEGATIVE   Ketones, ur >80 (*) NEGATIVE mg/dL   Protein, ur NEGATIVE  NEGATIVE mg/dL   Urobilinogen, UA 1.0  0.0 - 1.0 mg/dL   Nitrite NEGATIVE  NEGATIVE   Leukocytes, UA SMALL (*) NEGATIVE  PREGNANCY, URINE      Component Value Range   Preg Test, Ur NEGATIVE  NEGATIVE  URINE MICROSCOPIC-ADD ON      Component Value Range   Squamous Epithelial / LPF RARE  RARE   WBC, UA 3-6  <3 WBC/hpf   RBC / HPF 0-2  <3 RBC/hpf   Bacteria, UA FEW (*) RARE   Urine-Other MUCOUS PRESENT      Concerning for appendicitis. Will get CT abd/pelvis. Pain medications ordered.   VS normal.    Ct Abdomen Pelvis W Contrast  12/05/2011  *RADIOLOGY REPORT*  Clinical Data: Severe right lower quadrant pain.  Elevated white count.  CT ABDOMEN AND PELVIS WITH CONTRAST  Technique:  Multidetector CT imaging of the abdomen and pelvis was performed following the standard protocol during bolus administration of intravenous contrast.  Contrast:  OMNIPAQUE IOHEXOL 300 MG/ML  SOLN  Comparison: None.  Findings: 1.8 cm proximal appendicolith with dilated inflamed appendix beyond this region measuring up to area of the 1.9 cm. There may be a tiny appendicolith in the distal appendix. Surrounding inflammation without drainable  abscess seen separate from the inflamed appendix.  Within the liver they are nonspecific scattered low density structures measuring up to 1.1-1.4 cm.  These can be assessed on follow-up elective liver MRI.  Basilar atelectatic changes.  No calcified gallstone.  Small non calcified gallstones may be present.  No focal splenic, pancreatic, adrenal or renal lesion.  Small fatty containing periumbilical hernia.  Bowel traverses towards this region but does not become entrapped.  Uterus and ovaries unremarkable.  No bony destructive lesion.  Scattered top normal sized pelvic lymph nodes.  IMPRESSION: Appendicolith with the changes of appendicitis.  Nonspecific liver lesions.  Please see above.  Critical Value/emergent results were called by telephone at the time of interpretation on 12/04/2037  at 9:42 a.m.  to  [facility] ER physician's assistant., who verbally acknowledged these results.  Original Report Authenticated By: Fuller Canada, M.D.   CT positive for appendicitis. Dr. Hyman Hopes to call surgery for admission.      1. Acute appendicitis       MDM          Lottie Mussel, PA 12/05/11 1607

## 2011-12-05 NOTE — ED Notes (Signed)
Per EMS: Pt states that abdominal pain has gotten worse in the last hour. Denies chest pain or flank pain. Pain located in the epigastic area. Pain 10/10. Denies pregnancy. Vomiting bile. Blood tinged urine noted this morning with irritation when urinating.

## 2011-12-05 NOTE — H&P (Signed)
[name] is an 31 y.o. female.   Chief Complaint: Abdominal pain nausea and vomiting HPI: Patient's a 31 year old female who's been in good health. Yesterday she woke up around 10:30 AM a.m. with abdominal pain she describes it being in her sternum and her xiphoid. She had ongoing pain all day and developed nausea and vomiting. She has not been able to eat anything since Tuesday 12/03/11, everything she ate yesterday she vomited. She was actually seen and had labs drawn in the ER at [facility] last evening. She said the line was so long as she was so miserable she left and went to the drug store to find something. She got some ibuprofen p.m. which gave her no relief. She ultimately returned to the emergency room and [facility] at 4 AM today. Workup shows a white count of 14,200 last evening at 2305 hours. Repeat CBC this a.m. shows white count of 14,700 0648 hours. CT scan shows a 1.8 cm proximal appendicolith with dilated inflamed appendix beyond that region there is an area it is 1.9 cm with a possible small distal appendage: There is inflammation without drainable abscess seen separate from the inflamed appendix. This was consistent with acute appendicitis. Additionally she has some low density  structures in the liver 1.1-1.4 cm and they recommend MRI to further assess these areas. We plan to admit the patient for acute appendicitis an appendectomy.  History reviewed. No pertinent past medical history. Past medical history: None  Past Surgical History  Procedure Date  . Dilation and curettage of uterus   . Cesarean section   . Tubal ligation     No family history on file. Social History:  reports that she has been smoking.  She does not have any smokeless tobacco history on file. She reports that she drinks alcohol. Her drug history not on file.  Allergies: Not on File Prior to Admission medications   Medication Sig Start Date End Date Taking? Authorizing Provider    ibuprofen (ADVIL,MOTRIN) 200 MG tablet Take 200 mg by mouth every 6 (six) hours as needed.     Yes Historical Provider, MD     (Not in a hospital admission)  Results for orders placed during the hospital encounter of 12/05/11 (from the past 48 hour(s))  CBC     Status: Abnormal   Collection Time   12/05/11  6:48 AM      Component Value Range Comment   WBC 14.7 (*) 4.0 - 10.5 K/uL    RBC 4.72  3.87 - 5.11 MIL/uL    Hemoglobin 13.7  12.0 - 15.0 g/dL    HCT 45.4  09.8 - 11.9 %    MCV 86.9  78.0 - 100.0 fL    MCH 29.0  26.0 - 34.0 pg    MCHC 33.4  30.0 - 36.0 g/dL    RDW 14.7  82.9 - 56.2 %    Platelets 301  150 - 400 K/uL   COMPREHENSIVE METABOLIC PANEL     Status: Normal   Collection Time   12/05/11  6:48 AM      Component Value Range Comment   Sodium 136  135 - 145 mEq/L    Potassium 3.5  3.5 - 5.1 mEq/L    Chloride 102  96 - 112 mEq/L    CO2 21  19 - 32 mEq/L    Glucose, Bld 93  70 - 99 mg/dL    BUN 10  6 - 23 mg/dL  Creatinine, Ser 0.69  0.50 - 1.10 mg/dL    Calcium 9.6  8.4 - 47.8 mg/dL    Total Protein 8.0  6.0 - 8.3 g/dL    Albumin 4.4  3.5 - 5.2 g/dL    AST 16  0 - 37 U/L    ALT 27  0 - 35 U/L    Alkaline Phosphatase 72  39 - 117 U/L    Total Bilirubin 0.4  0.3 - 1.2 mg/dL    GFR calc non Af Amer >90  >90 mL/min    GFR calc Af Amer >90  >90 mL/min   LIPASE, BLOOD     Status: Normal   Collection Time   12/05/11  6:48 AM      Component Value Range Comment   Lipase 17  11 - 59 U/L   URINALYSIS, ROUTINE W REFLEX MICROSCOPIC     Status: Abnormal   Collection Time   12/05/11  7:32 AM      Component Value Range Comment   Color, Urine YELLOW  YELLOW    APPearance CLEAR  CLEAR    Specific Gravity, Urine 1.027  1.005 - 1.030    pH 7.5  5.0 - 8.0    Glucose, UA NEGATIVE  NEGATIVE mg/dL    Hgb urine dipstick NEGATIVE  NEGATIVE    Bilirubin Urine NEGATIVE  NEGATIVE    Ketones, ur >80 (*) NEGATIVE mg/dL    Protein, ur NEGATIVE  NEGATIVE mg/dL    Urobilinogen, UA 1.0   0.0 - 1.0 mg/dL    Nitrite NEGATIVE  NEGATIVE    Leukocytes, UA SMALL (*) NEGATIVE   PREGNANCY, URINE     Status: Normal   Collection Time   12/05/11  7:32 AM      Component Value Range Comment   Preg Test, Ur NEGATIVE  NEGATIVE   URINE MICROSCOPIC-ADD ON     Status: Abnormal   Collection Time   12/05/11  7:32 AM      Component Value Range Comment   Squamous Epithelial / LPF RARE  RARE    WBC, UA 3-6  <3 WBC/hpf    RBC / HPF 0-2  <3 RBC/hpf    Bacteria, UA FEW (*) RARE    Urine-Other MUCOUS PRESENT      Ct Abdomen Pelvis W Contrast  12/05/2011  *RADIOLOGY REPORT*  Clinical Data: Severe right lower quadrant pain.  Elevated white count.  CT ABDOMEN AND PELVIS WITH CONTRAST  Technique:  Multidetector CT imaging of the abdomen and pelvis was performed following the standard protocol during bolus administration of intravenous contrast.  Contrast: OMNIPAQUE IOHEXOL 300 MG/ML  SOLN  Comparison: None.  Findings: 1.8 cm proximal appendicolith with dilated inflamed appendix beyond this region measuring up to area of the 1.9 cm. There may be a tiny appendicolith in the distal appendix. Surrounding inflammation without drainable abscess seen separate from the inflamed appendix.  Within the liver they are nonspecific scattered low density structures measuring up to 1.1-1.4 cm.  These can be assessed on follow-up elective liver MRI.  Basilar atelectatic changes.  No calcified gallstone.  Small non calcified gallstones may be present.  No focal splenic, pancreatic, adrenal or renal lesion.  Small fatty containing periumbilical hernia.  Bowel traverses towards this region but does not become entrapped.  Uterus and ovaries unremarkable.  No bony destructive lesion.  Scattered top normal sized pelvic lymph nodes.  IMPRESSION: Appendicolith with the changes of appendicitis.  Nonspecific liver lesions.  Please see  above.  Critical Value/emergent results were called by telephone at the time of interpretation on  12/04/2037  at 9:42 a.m.  to  [facility] ER physician's assistant., who verbally acknowledged these results.  Original Report Authenticated By: Fuller Canada, M.D.    Review of Systems  Constitutional: Negative.   HENT: Negative.   Eyes: Negative.   Respiratory: Negative.   Cardiovascular: Positive for chest pain (pain was orginally at the xyphoid.  now it's all RLQ). Negative for palpitations, orthopnea, claudication, leg swelling and PND.  Gastrointestinal: Positive for heartburn (occasional), nausea, vomiting, abdominal pain and constipation (frequent).  Genitourinary: Positive for dysuria, urgency and hematuria.  Musculoskeletal: Negative.   Skin: Negative.   Neurological: Negative.   Endo/Heme/Allergies: Negative.   Psychiatric/Behavioral: Negative.     Blood pressure 112/67, pulse 79, temperature 98.7 F (37.1 C), temperature source Oral, resp. rate 15, last menstrual period 09/04/2011, SpO2 100.00%. Physical Exam  Constitutional: She is oriented to person, place, and time. She appears well-developed and well-nourished.  HENT:  Head: Normocephalic and atraumatic.  Nose: Nose normal.  Eyes: Conjunctivae and EOM are normal. Pupils are equal, round, and reactive to light. Right eye exhibits no discharge. Left eye exhibits no discharge.  Neck: Normal range of motion. Neck supple. No JVD present. No tracheal deviation present. No thyromegaly present.  Cardiovascular: Normal rate, regular rhythm, normal heart sounds and intact distal pulses.  Exam reveals no gallop.   No murmur heard. Respiratory: Effort normal and breath sounds normal. No stridor. No respiratory distress. She has no wheezes. She has no rales. She exhibits no tenderness.  GI: Soft. Bowel sounds are normal. She exhibits no distension and no mass. There is no tenderness. There is no rebound and no guarding.  Musculoskeletal: She exhibits no edema.       Hurts to walk now because of abdominal pain.    Lymphadenopathy:    She has no cervical adenopathy.  Neurological: She is alert and oriented to person, place, and time. She has normal reflexes. No cranial nerve deficit. Coordination normal.  Skin: Skin is warm and dry. No rash noted. No erythema.  Psychiatric: She has a normal mood and affect. Her behavior is normal. Judgment and thought content normal.     Assessment/Plan 1. Acute appendicitis 2. BMI 34.5  Plan:  Antibiotics, IV fluids, and appendectomy.  Risk and benefits discussed.  Dr. Michaell Cowing will talk with her before surgery also.   Ciclaly Mulcahey 12/05/2011, 11:08 AM

## 2011-12-05 NOTE — Op Note (Signed)
12/05/2011  1:44 PM  PATIENT:  [name]  31 y.o. female  Patient Care Team: Provider Default, MD as PCP - General  PRE-OPERATIVE DIAGNOSIS:  appendicitis  POST-OPERATIVE DIAGNOSIS:  Acute appendicitis with phlegmon  PROCEDURE:  Procedure(s): APPENDECTOMY LAPAROSCOPIC  SURGEON:  Surgeon(s): Ardeth Sportsman, MD  ASSISTANT: none   ANESTHESIA:   local and general  EBL:  Total I/O In: -  Out: 450 [Urine:350; Blood:100]  Delay start of Pharmacological VTE agent (>24hrs) due to surgical blood loss or risk of bleeding:  no  DRAINS: none   SPECIMEN:  Source of Specimen:  Appendix  DISPOSITION OF SPECIMEN:  PATHOLOGY  COUNTS:  YES  PLAN OF CARE: Admit for overnight observation  PATIENT DISPOSITION:  PACU - hemodynamically stable.  INDICATIONS: Morbidly obese female with worsening nausea vomiting and abdominal pain.  Tried to control with nonsteroidals but returned the ER.  CT scan suspicious for appendicitis.  We recommended diagnostic laparoscopy with probable appendectomy  The anatomy & physiology of the digestive tract was discussed.  The pathophysiology of appendicitis was discussed.  Natural history risks without surgery was discussed.   I feel the risks of no intervention will lead to serious problems that outweigh the operative risks; therefore, I recommended diagnostic laparoscopy with removal of appendix to remove the pathology.  Laparoscopic & open techniques were discussed.   I noted a good likelihood this will help address the problem.    Risks such as bleeding, infection, abscess, leak, reoperation, possible ostomy, hernia, heart attack, death, and other risks were discussed.  Goals of post-operative recovery were discussed as well.  We will work to minimize complications.  Questions were answered.  The patient expresses understanding & wishes to proceed with surgery.  OR FINDINGS:  Anterior appendix adherent to the uterus.  Moderate phlegmon suspicious  for early perforation.  Some purulence but no definite discrete abscess  DESCRIPTION:   The patient was identified & brought into the operating room. The patient was positioned supine with arms tucked. SCDs were active during the entire case. The patient underwent general anesthesia without any difficulty.  The abdomen was prepped and draped in a sterile fashion. A Surgical Timeout confirmed our plan.  I made a transverse incision through the superior umbilical fold.  I made a small transverse nick through the infraumbilical fascia and confirmed peritoneal entry.  I placed a 5mm port.  We induced carbon dioxide insufflation.  Camera inspection revealed no injury.  I placed a 5mm port in the LLQ and a 12mm port in the L suprapubic region.  I mobilized the terminal ileum and cecum and proximal ascending colon in a lateral to medial fashion.  I took care to avoid injuring any retroperitoneal structures.  I freed the appendix off its attachments to the ascending colon and cecal mesentery.  I elevated the appendix. I skeletonized the mesoappendix. I was able to free off the base of the appendix which was still viable.  I stapled the appendix off the cecum using a laparoscopic stapler. I took a healthy cuff viable cecum. I placed the appendix inside an EndoCatch bag and removed out the 12 mm port.  I did copious irrigation. Hemostasis was good in the mesoappendix, colon mesentery, and retroperitoneum. Staple line was intact on the cecum with no bleeding. I washed out the pelvis, retrohepatic space and right paracolic gutter. I washed out the left side as well.  Hemostasis is good. There was no perforation or injury. Because the area cleaned  up well after irrigation, I did not place a drain.  I aspirated the carbon dioxide. I removed the ports. I closed the 12 mm fascia site using a 0 Vicryl stitch. I closed skin using 4-0 monocryl stitch.  Patient was extubated and sent to the recovery room.  I am trying to  locate family to discuss the operative findings. I suspect the patient is going used in the hospital at least overnight and will need antibiotics for 5 more days.

## 2011-12-05 NOTE — Anesthesia Preprocedure Evaluation (Signed)
Anesthesia Evaluation  Patient identified by MRN, date of birth, ID band Patient awake  General Assessment Comment:NPO today CT contrast 08:00  Reviewed: Allergy & Precautions, H&P , NPO status , Patient's Chart, lab work & pertinent test results, reviewed documented beta blocker date and time   Airway Mallampati: I  Neck ROM: Full    Dental  (+) Teeth Intact   Pulmonary former smoker,  breath sounds clear to auscultation        Cardiovascular negative cardio ROS  Rhythm:Regular Rate:Normal  Denies cardiac symptoms   Neuro/Psych negative neurological ROS  negative psych ROS   GI/Hepatic negative GI ROS, Neg liver ROS, appendicitis   Endo/Other  negative endocrine ROS  Renal/GU negative Renal ROS  negative genitourinary   Musculoskeletal negative musculoskeletal ROS (+)   Abdominal   Peds negative pediatric ROS (+)  Hematology negative hematology ROS (+)   Anesthesia Other Findings   Reproductive/Obstetrics negative OB ROS                           Anesthesia Physical Anesthesia Plan  ASA: II and Emergent  Anesthesia Plan: General   Post-op Pain Management:    Induction: Intravenous, Rapid sequence and Cricoid pressure planned  Airway Management Planned: Oral ETT  Additional Equipment:   Intra-op Plan:   Post-operative Plan: Extubation in OR  Informed Consent: I have reviewed the patients History and Physical, chart, labs and discussed the procedure including the risks, benefits and alternatives for the proposed anesthesia with the patient or authorized representative who has indicated his/her understanding and acceptance.   Dental advisory given  Plan Discussed with: CRNA and Surgeon  Anesthesia Plan Comments:         Anesthesia Quick Evaluation

## 2011-12-05 NOTE — Preoperative (Signed)
Beta Blockers   Reason not to administer Beta Blockers:Not Applicable, not on home BB 

## 2011-12-05 NOTE — Anesthesia Postprocedure Evaluation (Signed)
  Anesthesia Post-op Note  Patient: [name]  Procedure(s) Performed: Procedure(s) (LRB): APPENDECTOMY LAPAROSCOPIC (N/A)  Patient Location: PACU  Anesthesia Type: General  Level of Consciousness: oriented and sedated  Airway and Oxygen Therapy: Patient Spontanous Breathing and Patient connected to nasal cannula oxygen  Post-op Pain: mild  Post-op Assessment: Post-op Vital signs reviewed, Patient's Cardiovascular Status Stable, Respiratory Function Stable and Patent Airway  Post-op Vital Signs: stable  Complications: No apparent anesthesia complications

## 2011-12-05 NOTE — H&P (Signed)
The anatomy & physiology of the digestive tract was discussed.  The pathophysiology of appendicitis was discussed.  Natural history risks without surgery was discussed.   I feel the risks of no intervention will lead to serious problems that outweigh the operative risks; therefore, I recommended diagnostic laparoscopy with removal of appendix to remove the pathology.  Laparoscopic & open techniques were discussed.   I noted a good likelihood this will help address the problem.    Risks such as bleeding, infection, abscess, leak, reoperation, possible ostomy, hernia, heart attack, death, and other risks were discussed.  Goals of post-operative recovery were discussed as well.  We will work to minimize complications.  Questions were answered.  The patient expresses understanding & wishes to proceed with surgery. 

## 2011-12-05 NOTE — ED Provider Notes (Signed)
Medical screening examination/treatment/procedure(s) were performed by non-physician practitioner and as supervising physician I was immediately available for consultation/collaboration.  Samaira Holzworth, MD 12/05/11 2357 

## 2011-12-05 NOTE — Anesthesia Postprocedure Evaluation (Signed)
  Anesthesia Post-op Note  Patient: [name]  Procedure(s) Performed: Procedure(s) (LRB): APPENDECTOMY LAPAROSCOPIC (N/A)  Patient Location: PACU  Anesthesia Type: General  Level of Consciousness: oriented and sedated  Airway and Oxygen Therapy: Patient Spontanous Breathing and Patient connected to nasal cannula oxygen  Post-op Pain: mild  Post-op Assessment: Post-op Vital signs reviewed, Patient's Cardiovascular Status Stable, Respiratory Function Stable and Patent Airway  Post-op Vital Signs: stable  Complications: No apparent anesthesia complications 

## 2011-12-06 ENCOUNTER — Encounter (HOSPITAL_COMMUNITY): Payer: Self-pay | Admitting: Surgery

## 2011-12-06 DIAGNOSIS — K3580 Unspecified acute appendicitis: Secondary | ICD-10-CM

## 2011-12-06 HISTORY — DX: Unspecified acute appendicitis: K35.80

## 2011-12-06 LAB — URINE CULTURE: Colony Count: NO GROWTH

## 2011-12-06 MED ORDER — SACCHAROMYCES BOULARDII 250 MG PO CAPS: 250.0000 mg | ORAL_CAPSULE | Freq: Two times a day (BID) | ORAL | Status: AC

## 2011-12-06 MED ORDER — AMOXICILLIN-POT CLAVULANATE 875-125 MG PO TABS: 1.0000 | ORAL_TABLET | Freq: Two times a day (BID) | ORAL | Status: AC

## 2011-12-06 MED ORDER — IBUPROFEN 200 MG PO TABS: 200.0000 mg | ORAL_TABLET | Freq: Four times a day (QID) | ORAL | Status: AC | PRN

## 2011-12-06 MED ORDER — AMOXICILLIN-POT CLAVULANATE 875-125 MG PO TABS
1.0000 | ORAL_TABLET | Freq: Two times a day (BID) | ORAL | Status: DC
  Administered 2011-12-06: 1 via ORAL
  Filled 2011-12-06 (×3): qty 1

## 2011-12-06 MED ORDER — HYDROCODONE-ACETAMINOPHEN 5-325 MG PO TABS: 1.0000 | ORAL_TABLET | ORAL | Status: AC | PRN

## 2011-12-06 MED ORDER — ACETAMINOPHEN 325 MG PO TABS: 325.0000 mg | ORAL_TABLET | Freq: Four times a day (QID) | ORAL | Status: AC | PRN

## 2011-12-06 MED ORDER — PSYLLIUM 95 % PO PACK: 1.0000 | PACK | Freq: Two times a day (BID) | ORAL | Status: DC | PRN

## 2011-12-06 NOTE — Care Management Note (Signed)
    Page 1 of 1   12/06/2011     12:08:41 PM   CARE MANAGEMENT NOTE 12/06/2011  Patient:  [name], [name]   Account Number:  [id number]  Date Initiated:  12/06/2011  Documentation initiated by:  Lorenda Ishihara  Subjective/Objective Assessment:   31 yo female admitted with appendicitis, s/p appendectomy. PTA lived at home with spouse.     Action/Plan:   no anticipated d/c needs   Anticipated DC Date:  12/06/2011   Anticipated DC Plan:  HOME/SELF CARE      DC Planning Services  CM consult      Choice offered to / List presented to:             Status of service:  Completed, signed off Medicare Important Message given?   (If response is "NO", the following Medicare IM given date fields will be blank) Date Medicare IM given:   Date Additional Medicare IM given:    Discharge Disposition:  HOME/SELF CARE  Per UR Regulation:  Reviewed for med. necessity/level of care/duration of stay  If discussed at Long Length of Stay Meetings, dates discussed:    Comments:

## 2011-12-06 NOTE — Progress Notes (Signed)
Patient provided with discharge instructions and prescriptions. Patient verbalized understanding. Patient discharged to home. 

## 2011-12-06 NOTE — Discharge Summary (Signed)
Physician Discharge Summary  Patient ID: [name] MRN: [id number] DOB/AGE: 08/19/1980 31 y.o.  Admit date: 12/05/2011 Discharge date: 12/06/2011  Admission Diagnoses: acute Appendicitis  Discharge Diagnoses: Acute appendicitis with phlegmon  BMI 34.5   Active Problems:  * No active hospital problems. *      PROCEDURES: APPENDECTOMY LAPAROSCOPIC 12/05/11 Dr.  Kalman Jewels Course: Patient's a 31 year old female who's been in good health. Yesterday she woke up around 10:30 AM a.m. with abdominal pain she describes it being in her sternum and her xiphoid. She had ongoing pain all day and developed nausea and vomiting. She has not been able to eat anything since Tuesday 12/03/11, everything she ate yesterday she vomited. She was actually seen and had labs drawn in the ER at [facility] last evening. She said the line was so long as she was so miserable she left and went to the drug store to find something. She got some ibuprofen p.m. which gave her no relief. She ultimately returned to the emergency room and [facility] at 4 AM today.  Workup shows a white count of 14,200 last evening at 2305 hours. Repeat CBC this a.m. shows white count of 14,700 0648 hours. CT scan shows a 1.8 cm proximal appendicolith with dilated inflamed appendix beyond that region there is an area it is 1.9 cm with a possible small distal appendage: There is inflammation without drainable abscess seen separate from the inflamed appendix. This was consistent with acute appendicitis. Additionally she has some low density structures in the liver 1.1-1.4 cm and they recommend MRI to further assess these areas. We plan to admit the patient for acute appendicitis an appendectomy. Dr.Gross reviewed and saw her in the OR holding area.  She had surgery and did well.  The first post op AM she was eating a regular breakfast.  If she does well we plan to discharge after lunch.  Follow up in 2 weeks, 5 more  days of Augmentin. Condition on D/C:  Improving   Disposition: 01-Home or Self Care   Medication List  As of 12/06/2011  8:23 AM   TAKE these medications         acetaminophen 325 MG tablet   Commonly known as: TYLENOL   Take 1-2 tablets (325-650 mg total) by mouth every 6 (six) hours as needed.      amoxicillin-clavulanate 875-125 MG per tablet   Commonly known as: AUGMENTIN   Take 1 tablet by mouth every 12 (twelve) hours.      HYDROcodone-acetaminophen 5-325 MG per tablet   Commonly known as: NORCO   Take 1-2 tablets by mouth every 4 (four) hours as needed.      ibuprofen 200 MG tablet   Commonly known as: ADVIL,MOTRIN   Take 1 tablet (200 mg total) by mouth every 6 (six) hours as needed for pain.      oxyCODONE 5 MG immediate release tablet   Commonly known as: Oxy IR/ROXICODONE   Take 1-2 tablets (5-10 mg total) by mouth every 4 (four) hours as needed for pain.      psyllium 95 % Pack   Commonly known as: HYDROCIL/METAMUCIL   Take 1 packet by mouth 2 (two) times daily as needed.      saccharomyces boulardii 250 MG capsule   Commonly known as: FLORASTOR   Take 1 capsule (250 mg total) by mouth 2 (two) times daily.           Follow-up Information  Follow up with GROSS,STEVEN C., MD. Schedule an appointment as soon as possible for a visit in 3 weeks.   Contact information:   [employer] [address] [place] [address] [phone]          Signed: Sherrie George 12/06/2011, 8:23 AM

## 2011-12-06 NOTE — Progress Notes (Signed)
Improving Agree w abx given phlegmon & prob perforation

## 2011-12-06 NOTE — Progress Notes (Signed)
UR complete 

## 2011-12-06 NOTE — Progress Notes (Signed)
1 Day Post-Op  Subjective: Up eating breakfast, feels better. Still sore.  Objective: Vital signs in last 24 hours: Temp:  [97.6 F (36.4 C)-99.1 F (37.3 C)] 98.3 F (36.8 C) (07/12 0611) Pulse Rate:  [60-89] 60  (07/12 0611) Resp:  [15-18] 18  (07/12 0611) BP: (99-115)/(61-74) 99/62 mmHg (07/12 0611) SpO2:  [97 %-100 %] 98 % (07/12 0611) Weight:  [95.7 kg (210 lb 15.7 oz)] 95.7 kg (210 lb 15.7 oz) (07/11 1457)   100 ml PO, Diet: bland, VSS stable, no labs.    Intake/Output from previous day: 07/11 0701 - 07/12 0700 In: 2495 [P.O.:100; I.V.:2245; IV Piggyback:150] Out: 750 [Urine:650; Blood:100] Intake/Output this shift:    General appearance: alert, cooperative and no distress GI: soft, tender, few BS, eating regular breakfast.  Incsion sites are clean and dry.  Lab Results:   Basename 12/05/11 0648 12/04/11 2305  WBC 14.7* 14.2*  HGB 13.7 13.3  HCT 41.0 39.5  PLT 301 262    BMET  Basename 12/05/11 0648 12/04/11 2305  NA 136 139  K 3.5 3.2*  CL 102 103  CO2 21 23  GLUCOSE 93 91  BUN 10 8  CREATININE 0.69 0.68  CALCIUM 9.6 9.3   PT/INR No results found for this basename: LABPROT:2,INR:2 in the last 72 hours   Lab 12/05/11 0648 12/04/11 2305  AST 16 17  ALT 27 27  ALKPHOS 72 67  BILITOT 0.4 0.3  PROT 8.0 7.7  ALBUMIN 4.4 4.1     Lipase     Component Value Date/Time   LIPASE 17 12/05/2011 0648     Studies/Results: Ct Abdomen Pelvis W Contrast  12/05/2011  *RADIOLOGY REPORT*  Clinical Data: Severe right lower quadrant pain.  Elevated white count.  CT ABDOMEN AND PELVIS WITH CONTRAST  Technique:  Multidetector CT imaging of the abdomen and pelvis was performed following the standard protocol during bolus administration of intravenous contrast.  Contrast: OMNIPAQUE IOHEXOL 300 MG/ML  SOLN  Comparison: None.  Findings: 1.8 cm proximal appendicolith with dilated inflamed appendix beyond this region measuring up to area of the 1.9 cm. There may be  a tiny appendicolith in the distal appendix. Surrounding inflammation without drainable abscess seen separate from the inflamed appendix.  Within the liver they are nonspecific scattered low density structures measuring up to 1.1-1.4 cm.  These can be assessed on follow-up elective liver MRI.  Basilar atelectatic changes.  No calcified gallstone.  Small non calcified gallstones may be present.  No focal splenic, pancreatic, adrenal or renal lesion.  Small fatty containing periumbilical hernia.  Bowel traverses towards this region but does not become entrapped.  Uterus and ovaries unremarkable.  No bony destructive lesion.  Scattered top normal sized pelvic lymph nodes.  IMPRESSION: Appendicolith with the changes of appendicitis.  Nonspecific liver lesions.  Please see above.  Critical Value/emergent results were called by telephone at the time of interpretation on 12/04/2037  at 9:42 a.m.  to  [facility] ER physician's assistant., who verbally acknowledged these results.  Original Report Authenticated By: Fuller Canada, M.D.    Medications:    . cefTRIAXone (ROCEPHIN)  IV  2 g Intravenous Q24H  . lip balm  1 application Topical BID  . metronidazole  500 mg Intravenous Q8H  . morphine  4 mg Intravenous Once  . morphine  6 mg Intravenous Once  . naproxen  500 mg Oral BID WC  . psyllium  1 packet Oral BID  . saccharomyces boulardii  250 mg Oral BID  . sodium chloride  3 mL Intravenous Q12H    Assessment/Plan Acute appendicitis with phlegmon   BMI 34.5    Plan:  Mobilize, if she does well home after lunch. Plan 5 more days of Augmentin.       LOS: 1 day    [name] 12/06/2011

## 2016-05-17 ENCOUNTER — Encounter (HOSPITAL_COMMUNITY): Payer: Self-pay | Admitting: Emergency Medicine

## 2016-05-17 DIAGNOSIS — O00102 Left tubal pregnancy without intrauterine pregnancy: Secondary | ICD-10-CM | POA: Insufficient documentation

## 2016-05-17 DIAGNOSIS — O99332 Smoking (tobacco) complicating pregnancy, second trimester: Secondary | ICD-10-CM | POA: Diagnosis not present

## 2016-05-17 DIAGNOSIS — Z9851 Tubal ligation status: Secondary | ICD-10-CM | POA: Insufficient documentation

## 2016-05-17 DIAGNOSIS — F17200 Nicotine dependence, unspecified, uncomplicated: Secondary | ICD-10-CM | POA: Diagnosis not present

## 2016-05-17 DIAGNOSIS — D500 Iron deficiency anemia secondary to blood loss (chronic): Secondary | ICD-10-CM | POA: Diagnosis not present

## 2016-05-17 DIAGNOSIS — Z3490 Encounter for supervision of normal pregnancy, unspecified, unspecified trimester: Secondary | ICD-10-CM | POA: Diagnosis present

## 2016-05-17 LAB — URINALYSIS, ROUTINE W REFLEX MICROSCOPIC
Bacteria, UA: NONE SEEN
Bilirubin Urine: NEGATIVE
GLUCOSE, UA: NEGATIVE mg/dL
Hgb urine dipstick: NEGATIVE
KETONES UR: 5 mg/dL — AB
Nitrite: NEGATIVE
PH: 5 (ref 5.0–8.0)
Protein, ur: NEGATIVE mg/dL
SPECIFIC GRAVITY, URINE: 1.024 (ref 1.005–1.030)

## 2016-05-17 LAB — I-STAT BETA HCG BLOOD, ED (MC, WL, AP ONLY): I-stat hCG, quantitative: 1867.4 m[IU]/mL — ABNORMAL HIGH (ref <5)

## 2016-05-17 LAB — COMPREHENSIVE METABOLIC PANEL
ALK PHOS: 50 U/L (ref 38–126)
ALT: 23 U/L (ref 14–54)
AST: 16 U/L (ref 15–41)
Albumin: 3.8 g/dL (ref 3.5–5.0)
Anion gap: 8 (ref 5–15)
BUN: 9 mg/dL (ref 6–20)
CALCIUM: 8.8 mg/dL — AB (ref 8.9–10.3)
CHLORIDE: 109 mmol/L (ref 101–111)
CO2: 20 mmol/L — AB (ref 22–32)
CREATININE: 0.76 mg/dL (ref 0.44–1.00)
GFR calc non Af Amer: >60 mL/min (ref >60)
Glucose, Bld: 97 mg/dL (ref 65–99)
Potassium: 3.2 mmol/L — ABNORMAL LOW (ref 3.5–5.1)
SODIUM: 137 mmol/L (ref 135–145)
Total Bilirubin: 0.3 mg/dL (ref 0.3–1.2)
Total Protein: 6.6 g/dL (ref 6.5–8.1)

## 2016-05-17 LAB — CBC
HCT: 31.9 % — ABNORMAL LOW (ref 36.0–46.0)
Hemoglobin: 10.4 g/dL — ABNORMAL LOW (ref 12.0–15.0)
MCH: 28.3 pg (ref 26.0–34.0)
MCHC: 32.6 g/dL (ref 30.0–36.0)
MCV: 86.7 fL (ref 78.0–100.0)
PLATELETS: 269 10*3/uL (ref 150–400)
RBC: 3.68 MIL/uL — ABNORMAL LOW (ref 3.87–5.11)
RDW: 13.3 % (ref 11.5–15.5)
WBC: 11.7 10*3/uL — ABNORMAL HIGH (ref 4.0–10.5)

## 2016-05-17 LAB — LIPASE, BLOOD: LIPASE: 19 U/L (ref 11–51)

## 2016-05-17 NOTE — ED Triage Notes (Signed)
Pt presents to ED for assessment of RLQ pain, mostly to left groin, no radiation.  Pt denies n/v.  Complains of intermittent diarrhea.  Pt sts it felt like menstrual cramps at first, but has not started her menstruation, and pain has become more severe.   Pt had appendix out 2 years ago.  Denies any changes in urination.

## 2016-05-18 ENCOUNTER — Emergency Department (HOSPITAL_COMMUNITY): Payer: Medicaid Other

## 2016-05-18 ENCOUNTER — Ambulatory Visit (HOSPITAL_COMMUNITY)
Admission: AD | Admit: 2016-05-18 | Discharge: 2016-05-18 | Disposition: A | Discharge status: Home / Self Care | Payer: Medicaid Other | Attending: Obstetrics and Gynecology | Admitting: Obstetrics and Gynecology

## 2016-05-18 ENCOUNTER — Inpatient Hospital Stay (HOSPITAL_COMMUNITY): Payer: Medicaid Other | Admitting: Anesthesiology

## 2016-05-18 ENCOUNTER — Encounter (HOSPITAL_COMMUNITY)
Admission: AD | Disposition: A | Discharge status: Home / Self Care | Payer: Self-pay | Source: Home / Self Care | Attending: Emergency Medicine

## 2016-05-18 DIAGNOSIS — Z3490 Encounter for supervision of normal pregnancy, unspecified, unspecified trimester: Secondary | ICD-10-CM

## 2016-05-18 DIAGNOSIS — D500 Iron deficiency anemia secondary to blood loss (chronic): Secondary | ICD-10-CM

## 2016-05-18 DIAGNOSIS — O009 Unspecified ectopic pregnancy without intrauterine pregnancy: Secondary | ICD-10-CM

## 2016-05-18 DIAGNOSIS — O99332 Smoking (tobacco) complicating pregnancy, second trimester: Secondary | ICD-10-CM | POA: Diagnosis not present

## 2016-05-18 DIAGNOSIS — F17200 Nicotine dependence, unspecified, uncomplicated: Secondary | ICD-10-CM | POA: Diagnosis not present

## 2016-05-18 DIAGNOSIS — O26891 Other specified pregnancy related conditions, first trimester: Secondary | ICD-10-CM

## 2016-05-18 DIAGNOSIS — O00102 Left tubal pregnancy without intrauterine pregnancy: Secondary | ICD-10-CM | POA: Diagnosis not present

## 2016-05-18 DIAGNOSIS — R102 Pelvic and perineal pain: Secondary | ICD-10-CM

## 2016-05-18 HISTORY — PX: DIAGNOSTIC LAPAROSCOPY WITH REMOVAL OF ECTOPIC PREGNANCY: SHX6449

## 2016-05-18 LAB — TYPE AND SCREEN
ABO/RH(D): O POS
ABO/RH(D): O POS
Antibody Screen: NEGATIVE
Antibody Screen: NEGATIVE

## 2016-05-18 LAB — I-STAT CG4 LACTIC ACID, ED: LACTIC ACID, VENOUS: 0.82 mmol/L (ref 0.5–1.9)

## 2016-05-18 LAB — HCG, QUANTITATIVE, PREGNANCY: hCG, Beta Chain, Quant, S: 2546 m[IU]/mL — ABNORMAL HIGH (ref <5)

## 2016-05-18 LAB — ABO/RH
ABO/RH(D): O POS
ABO/RH(D): O POS

## 2016-05-18 LAB — RPR: RPR: NONREACTIVE

## 2016-05-18 LAB — HIV ANTIBODY (ROUTINE TESTING W REFLEX): HIV SCREEN 4TH GENERATION: NONREACTIVE

## 2016-05-18 SURGERY — LAPAROSCOPY, WITH ECTOPIC PREGNANCY SURGICAL TREATMENT
Anesthesia: General | Site: Abdomen

## 2016-05-18 MED ORDER — ONDANSETRON HCL 4 MG/2ML IJ SOLN
4.0000 mg | Freq: Once | INTRAMUSCULAR | Status: AC
  Administered 2016-05-18: 4 mg via INTRAVENOUS
  Filled 2016-05-18: qty 2

## 2016-05-18 MED ORDER — ONDANSETRON HCL 4 MG/2ML IJ SOLN
INTRAMUSCULAR | Status: DC | PRN
Start: 2016-05-18 — End: 2016-05-18
  Administered 2016-05-18: 4 mg via INTRAVENOUS

## 2016-05-18 MED ORDER — FENTANYL CITRATE (PF) 100 MCG/2ML IJ SOLN
INTRAMUSCULAR | Status: AC
  Administered 2016-05-18: 25 ug via INTRAVENOUS
  Filled 2016-05-18: qty 2

## 2016-05-18 MED ORDER — NEOSTIGMINE METHYLSULFATE 10 MG/10ML IV SOLN
INTRAVENOUS | Status: DC | PRN
  Administered 2016-05-18: 2.5 mg via INTRAVENOUS

## 2016-05-18 MED ORDER — SOD CITRATE-CITRIC ACID 500-334 MG/5ML PO SOLN
ORAL | Status: AC
  Filled 2016-05-18: qty 15

## 2016-05-18 MED ORDER — MIDAZOLAM HCL 2 MG/2ML IJ SOLN
INTRAMUSCULAR | Status: AC
  Filled 2016-05-18: qty 2

## 2016-05-18 MED ORDER — DOCUSATE SODIUM 100 MG PO CAPS: 100.0000 mg | ORAL_CAPSULE | Freq: Two times a day (BID) | ORAL | 2 refills | Status: DC | PRN

## 2016-05-18 MED ORDER — FENTANYL CITRATE (PF) 100 MCG/2ML IJ SOLN
25.0000 ug | INTRAMUSCULAR | Status: DC | PRN
  Administered 2016-05-18 (×2): 25 ug via INTRAVENOUS

## 2016-05-18 MED ORDER — BUPIVACAINE HCL (PF) 0.25 % IJ SOLN
INTRAMUSCULAR | Status: DC | PRN
  Administered 2016-05-18: 20 mL

## 2016-05-18 MED ORDER — SOD CITRATE-CITRIC ACID 500-334 MG/5ML PO SOLN
30.0000 mL | Freq: Once | ORAL | Status: AC
  Administered 2016-05-18: 30 mL via ORAL

## 2016-05-18 MED ORDER — FENTANYL CITRATE (PF) 250 MCG/5ML IJ SOLN
INTRAMUSCULAR | Status: DC | PRN
  Administered 2016-05-18 (×2): 150 ug via INTRAVENOUS
  Administered 2016-05-18 (×2): 100 ug via INTRAVENOUS

## 2016-05-18 MED ORDER — DEXAMETHASONE SODIUM PHOSPHATE 10 MG/ML IJ SOLN
INTRAMUSCULAR | Status: AC
Start: 2016-05-18 — End: 2016-05-18
  Filled 2016-05-18: qty 1

## 2016-05-18 MED ORDER — OXYCODONE-ACETAMINOPHEN 5-325 MG PO TABS
1.0000 | ORAL_TABLET | ORAL | 0 refills | Status: DC | PRN
Start: 2016-05-18 — End: 2016-05-22

## 2016-05-18 MED ORDER — SUCCINYLCHOLINE CHLORIDE 200 MG/10ML IV SOSY
PREFILLED_SYRINGE | INTRAVENOUS | Status: AC
  Filled 2016-05-18: qty 10

## 2016-05-18 MED ORDER — PROPOFOL 10 MG/ML IV BOLUS
INTRAVENOUS | Status: AC
  Filled 2016-05-18: qty 20

## 2016-05-18 MED ORDER — SODIUM CHLORIDE 0.9 % IV SOLN
INTRAVENOUS | Status: DC | PRN
  Administered 2016-05-18: 04:00:00 via INTRAVENOUS

## 2016-05-18 MED ORDER — MIDAZOLAM HCL 5 MG/5ML IJ SOLN
INTRAMUSCULAR | Status: DC | PRN
  Administered 2016-05-18: 2 mg via INTRAVENOUS

## 2016-05-18 MED ORDER — FENTANYL CITRATE (PF) 250 MCG/5ML IJ SOLN
INTRAMUSCULAR | Status: AC
  Filled 2016-05-18: qty 5

## 2016-05-18 MED ORDER — LACTATED RINGERS IR SOLN
Status: DC | PRN
  Administered 2016-05-18: 3000 mL

## 2016-05-18 MED ORDER — OXYCODONE-ACETAMINOPHEN 5-325 MG PO TABS
ORAL_TABLET | ORAL | Status: AC
  Filled 2016-05-18: qty 1

## 2016-05-18 MED ORDER — GLYCOPYRROLATE 0.2 MG/ML IJ SOLN
INTRAMUSCULAR | Status: AC
  Filled 2016-05-18: qty 4

## 2016-05-18 MED ORDER — PROPOFOL 10 MG/ML IV BOLUS
INTRAVENOUS | Status: DC | PRN
  Administered 2016-05-18: 180 mg via INTRAVENOUS

## 2016-05-18 MED ORDER — ROCURONIUM BROMIDE 100 MG/10ML IV SOLN
INTRAVENOUS | Status: DC | PRN
  Administered 2016-05-18: 10 mg via INTRAVENOUS
  Administered 2016-05-18: 20 mg via INTRAVENOUS

## 2016-05-18 MED ORDER — NEOSTIGMINE METHYLSULFATE 10 MG/10ML IV SOLN
INTRAVENOUS | Status: AC
  Filled 2016-05-18: qty 1

## 2016-05-18 MED ORDER — FAMOTIDINE IN NACL 20-0.9 MG/50ML-% IV SOLN
INTRAVENOUS | Status: AC
  Filled 2016-05-18: qty 50

## 2016-05-18 MED ORDER — DEXAMETHASONE SODIUM PHOSPHATE 10 MG/ML IJ SOLN
INTRAMUSCULAR | Status: DC | PRN
  Administered 2016-05-18: 10 mg via INTRAVENOUS

## 2016-05-18 MED ORDER — LIDOCAINE HCL (CARDIAC) 20 MG/ML IV SOLN
INTRAVENOUS | Status: AC
  Filled 2016-05-18: qty 5

## 2016-05-18 MED ORDER — MORPHINE SULFATE (PF) 4 MG/ML IV SOLN
4.0000 mg | Freq: Once | INTRAVENOUS | Status: AC
Start: 2016-05-18 — End: 2016-05-18
  Administered 2016-05-18: 4 mg via INTRAVENOUS
  Filled 2016-05-18: qty 1

## 2016-05-18 MED ORDER — SUCCINYLCHOLINE CHLORIDE 200 MG/10ML IV SOSY
PREFILLED_SYRINGE | INTRAVENOUS | Status: DC | PRN
  Administered 2016-05-18: 120 mg via INTRAVENOUS

## 2016-05-18 MED ORDER — LIDOCAINE HCL (CARDIAC) 20 MG/ML IV SOLN
INTRAVENOUS | Status: DC | PRN
  Administered 2016-05-18: 60 mg via INTRAVENOUS

## 2016-05-18 MED ORDER — FAMOTIDINE IN NACL 20-0.9 MG/50ML-% IV SOLN
20.0000 mg | Freq: Once | INTRAVENOUS | Status: AC
  Administered 2016-05-18: 20 mg via INTRAVENOUS

## 2016-05-18 MED ORDER — IBUPROFEN 600 MG PO TABS: 600.0000 mg | ORAL_TABLET | Freq: Four times a day (QID) | ORAL | 1 refills | Status: DC | PRN

## 2016-05-18 MED ORDER — ONDANSETRON HCL 4 MG/2ML IJ SOLN
INTRAMUSCULAR | Status: AC
  Filled 2016-05-18: qty 2

## 2016-05-18 MED ORDER — OXYCODONE-ACETAMINOPHEN 5-325 MG PO TABS
1.0000 | ORAL_TABLET | ORAL | Status: DC | PRN
  Administered 2016-05-18: 1 via ORAL

## 2016-05-18 MED ORDER — GLYCOPYRROLATE 0.2 MG/ML IJ SOLN
INTRAMUSCULAR | Status: DC | PRN
  Administered 2016-05-18: .5 mg via INTRAVENOUS

## 2016-05-18 MED ORDER — LACTATED RINGERS IV SOLN
INTRAVENOUS | Status: DC | PRN
  Administered 2016-05-18: 05:00:00 via INTRAVENOUS

## 2016-05-18 MED ORDER — BUPIVACAINE HCL (PF) 0.25 % IJ SOLN
INTRAMUSCULAR | Status: AC
  Filled 2016-05-18: qty 30

## 2016-05-18 SURGICAL SUPPLY — 31 items
DEFOGGER SCOPE WARMER CLEARIFY (MISCELLANEOUS) ×3 IMPLANT
DERMABOND ADVANCED (GAUZE/BANDAGES/DRESSINGS) ×2
DERMABOND ADVANCED .7 DNX12 (GAUZE/BANDAGES/DRESSINGS) ×1 IMPLANT
DRSG OPSITE POSTOP 3X4 (GAUZE/BANDAGES/DRESSINGS) IMPLANT
DURAPREP 26ML APPLICATOR (WOUND CARE) ×3 IMPLANT
ELECT REM PT RETURN 9FT ADLT (ELECTROSURGICAL)
ELECTRODE REM PT RTRN 9FT ADLT (ELECTROSURGICAL) IMPLANT
GLOVE BIOGEL PI IND STRL 6.5 (GLOVE) ×1 IMPLANT
GLOVE BIOGEL PI IND STRL 7.0 (GLOVE) ×1 IMPLANT
GLOVE BIOGEL PI INDICATOR 6.5 (GLOVE) ×2
GLOVE BIOGEL PI INDICATOR 7.0 (GLOVE) ×2
GLOVE SURG SS PI 6.0 STRL IVOR (GLOVE) ×3 IMPLANT
GOWN STRL REUS W/TWL LRG LVL3 (GOWN DISPOSABLE) ×6 IMPLANT
NS IRRIG 1000ML POUR BTL (IV SOLUTION) ×3 IMPLANT
PACK LAPAROSCOPY BASIN (CUSTOM PROCEDURE TRAY) ×3 IMPLANT
PACK TRENDGUARD 450 HYBRID PRO (MISCELLANEOUS) IMPLANT
PACK TRENDGUARD 600 HYBRD PROC (MISCELLANEOUS) ×1 IMPLANT
POUCH SPECIMEN RETRIEVAL 10MM (ENDOMECHANICALS) ×3 IMPLANT
PROTECTOR NERVE ULNAR (MISCELLANEOUS) ×6 IMPLANT
SET IRRIG TUBING LAPAROSCOPIC (IRRIGATION / IRRIGATOR) ×3 IMPLANT
SHEARS HARMONIC ACE PLUS 36CM (ENDOMECHANICALS) ×3 IMPLANT
SLEEVE XCEL OPT CAN 5 100 (ENDOMECHANICALS) ×3 IMPLANT
SUT MNCRL AB 4-0 PS2 18 (SUTURE) ×3 IMPLANT
SUT VICRYL 0 UR6 27IN ABS (SUTURE) ×6 IMPLANT
TOWEL OR 17X24 6PK STRL BLUE (TOWEL DISPOSABLE) ×6 IMPLANT
TRAY FOLEY CATH SILVER 14FR (SET/KITS/TRAYS/PACK) ×3 IMPLANT
TRENDGUARD 450 HYBRID PRO PACK (MISCELLANEOUS)
TRENDGUARD 600 HYBRID PROC PK (MISCELLANEOUS) ×3
TROCAR BALLN 12MMX100 BLUNT (TROCAR) ×3 IMPLANT
TROCAR XCEL NON-BLD 5MMX100MML (ENDOMECHANICALS) ×3 IMPLANT
WATER STERILE IRR 1000ML POUR (IV SOLUTION) IMPLANT

## 2016-05-18 NOTE — ED Provider Notes (Signed)
[facility]-EMERGENCY DEPT Provider Note   CSN: [id number] Arrival date & time: 05/17/16  2150  By signing my name below, I, Nelwyn Salisbury, attest that this documentation has been prepared under the direction and in the presence of Dione Booze, MD . Electronically Signed: Nelwyn Salisbury, Scribe. 05/18/2016. 1:24 AM.  History   Chief Complaint Chief Complaint  Patient presents with  . Abdominal Pain   HPI  HPI Comments:  [name] is an otherwise healthy 35 y.o. female who presents to the Emergency Department complaining of sudden-onset constant LLQ abdominal pain beginning about 10 days ago. She describes her pain as a 10/10 pain that radiates to her RLQ. Her pain is aggravated by palpation and alleviated by warm compresses. She has tried OTC painkillers for her symptoms with minimal relief. She reports associated diarrhea and vaginal discharge. Pt denies any fever, chills, nausea, diaphoresis, difficulty urinating, frequency or dysuria. Her LNMP was in October. She has a pshx of tubal ligation on 07/25/2005 by Dr. Emelda Fear. GPA of 4/2/1.  History reviewed. No pertinent past medical history.  Patient Active Problem List   Diagnosis Date Noted  . Acute appendicitis 12/06/2011    Past Surgical History:  Procedure Laterality Date  . CESAREAN SECTION    . DILATION AND CURETTAGE OF UTERUS    . LAPAROSCOPIC APPENDECTOMY  12/05/2011   Procedure: APPENDECTOMY LAPAROSCOPIC;  Surgeon: Ardeth Sportsman, MD;  Location: [facility] ORS;  Service: General;  Laterality: N/A;  . TUBAL LIGATION      OB History    No data available       Home Medications    Prior to Admission medications   Medication Sig Start Date End Date Taking? Authorizing Provider  psyllium (HYDROCIL/METAMUCIL) 95 % PACK Take 1 packet by mouth 2 (two) times daily as needed. 12/06/11   Sherrie George, PA-C    Family History History reviewed. No pertinent family history.  Social History Social History  Substance Use  Topics  . Smoking status: Current Every Day Smoker    Packs/day: 0.50    Years: 8.00  . Smokeless tobacco: Never Used  . Alcohol use 0.0 oz/week     Comment: 6 pack of beer per week     Allergies   Patient has no known allergies.   Review of Systems Review of Systems  Constitutional: Negative for chills, diaphoresis and fever.  Gastrointestinal: Positive for abdominal pain and diarrhea. Negative for nausea.  Genitourinary: Positive for vaginal discharge. Negative for difficulty urinating, dysuria and frequency.  All other systems reviewed and are negative.    Physical Exam Updated Vital Signs BP 117/67 (BP Location: Left Arm)   Pulse 95   Temp 99.3 F (37.4 C) (Oral)   Resp 16   Ht 5\' 4"  (1.626 m)   Wt 203 lb (92.1 kg)   SpO2 99%   BMI 34.84 kg/m   Physical Exam  Constitutional: She is oriented to person, place, and time. She appears well-developed and well-nourished.  HENT:  Head: Normocephalic and atraumatic.  Eyes: EOM are normal. Pupils are equal, round, and reactive to light.  Neck: Normal range of motion. Neck supple. No JVD present.  Cardiovascular: Normal rate, regular rhythm and normal heart sounds.   No murmur heard. Pulmonary/Chest: Effort normal and breath sounds normal. She has no wheezes. She has no rales. She exhibits no tenderness.  Abdominal: Soft. Bowel sounds are normal. She exhibits no distension and no mass. There is no tenderness.  Suprapubic tenderness  Musculoskeletal: Normal  range of motion. She exhibits no edema.  Lymphadenopathy:    She has no cervical adenopathy.  Neurological: She is alert and oriented to person, place, and time. No cranial nerve deficit. She exhibits normal muscle tone. Coordination normal.  Skin: Skin is warm and dry. No rash noted.  Psychiatric: She has a normal mood and affect. Her behavior is normal. Judgment and thought content normal.  Nursing note and vitals reviewed.    ED Treatments / Results  DIAGNOSTIC  STUDIES:  Oxygen Saturation is 99% on RA, normal by my interpretation.    COORDINATION OF CARE:  1:17 AM Discussed treatment plan with pt at bedside which includes a pelvic exam and pt agreed to plan.  Labs (all labs ordered are listed, but only abnormal results are displayed) Labs Reviewed  COMPREHENSIVE METABOLIC PANEL - Abnormal; Notable for the following:       Result Value   Potassium 3.2 (*)    CO2 20 (*)    Calcium 8.8 (*)    All other components within normal limits  CBC - Abnormal; Notable for the following:    WBC 11.7 (*)    RBC 3.68 (*)    Hemoglobin 10.4 (*)    HCT 31.9 (*)    All other components within normal limits  URINALYSIS, ROUTINE W REFLEX MICROSCOPIC - Abnormal; Notable for the following:    Ketones, ur 5 (*)    Leukocytes, UA TRACE (*)    Squamous Epithelial / LPF 0-5 (*)    All other components within normal limits  HCG, QUANTITATIVE, PREGNANCY - Abnormal; Notable for the following:    hCG, Beta Chain, Quant, S 2,546 (*)    All other components within normal limits  I-STAT BETA HCG BLOOD, ED ([facility], [facility], [facility] ONLY) - Abnormal; Notable for the following:    I-stat hCG, quantitative 1,867.4 (*)    All other components within normal limits  LIPASE, BLOOD  RPR  HIV ANTIBODY (ROUTINE TESTING)  I-STAT CG4 LACTIC ACID, ED  TYPE AND SCREEN  ABO/RH  GC[facility]AM[facility]    Radiology [place] Ob Comp Less 14 Wks  Result Date: 05/18/2016 CLINICAL DATA:  Left-sided pelvic pain. History of tubal ligation. Positive pregnancy test with quantitative HCG 1867 EXAM: OBSTETRIC <14 WK US AND TRANSVAGINAL OB US TECHNIQUE: Both transabdominal and transvaginal ultrasound examinations were performed for complete evaluation of the gestation as well as the maternal uterus, adnexal regions, and pelvic cul-de-sac. Transvaginal technique was performed to assess early pregnancy. COMPARISON:  None. FINDINGS: Intrauterine gestational sac: Not visible Yolk sac:   Not visible Embryo:  Not visible Cardiac Activity: Not visible Heart Rate:   bpm MSD:   mm    w     d CRL:    mm    w    d                  US EDC: Subchorionic hemorrhage:  None visualized. Maternal uterus/adnexae: Left ovary appears normal. Complex left paraovarian mass measuring 3.4 x 4.5 x 7.0 cm. Within this, there is a hypoechoic 2 cm focus which could represent a gestational sac. There is a moderate volume of blood or complex fluid in the pelvis. Right ovary and right adnexal structures appear normal. Endometrium is homogeneous and thickened, 17 mm. No intrauterine sac. IMPRESSION: Findings are highly suspicious for a left parovarian ectopic gestation, and moderate volume blood in the pelvis. These results were called by telephone at the  time of interpretation on 05/18/2016 at 2:45 am to Dr. Dione Booze , who verbally acknowledged these results. Electronically Signed   By: Ellery Plunk M.D.   On: 05/18/2016 02:49   US Ob Transvaginal  Result Date: 05/18/2016 CLINICAL DATA:  Left-sided pelvic pain. History of tubal ligation. Positive pregnancy test with quantitative HCG 1867 EXAM: OBSTETRIC <14 WK [place] AND TRANSVAGINAL OB US TECHNIQUE: Both transabdominal and transvaginal ultrasound examinations were performed for complete evaluation of the gestation as well as the maternal uterus, adnexal regions, and pelvic cul-de-sac. Transvaginal technique was performed to assess early pregnancy. COMPARISON:  None. FINDINGS: Intrauterine gestational sac: Not visible Yolk sac:  Not visible Embryo:  Not visible Cardiac Activity: Not visible Heart Rate:   bpm MSD:   mm    w     d CRL:    mm    w    d                  [place] EDC: Subchorionic hemorrhage:  None visualized. Maternal uterus/adnexae: Left ovary appears normal. Complex left paraovarian mass measuring 3.4 x 4.5 x 7.0 cm. Within this, there is a hypoechoic 2 cm focus which could represent a gestational sac. There is a moderate volume of blood or complex fluid in  the pelvis. Right ovary and right adnexal structures appear normal. Endometrium is homogeneous and thickened, 17 mm. No intrauterine sac. IMPRESSION: Findings are highly suspicious for a left parovarian ectopic gestation, and moderate volume blood in the pelvis. These results were called by telephone at the time of interpretation on 05/18/2016 at 2:45 am to Dr. Dione Booze , who verbally acknowledged these results. Electronically Signed   By: Ellery Plunk M.D.   On: 05/18/2016 02:49    Procedures Procedures (including critical care time) CRITICAL CARE Performed by: IONGE,XBMWU Total critical care time: 50 minutes Critical care time was exclusive of separately billable procedures and treating other patients. Critical care was necessary to treat or prevent imminent or life-threatening deterioration. Critical care was time spent personally by me on the following activities: development of treatment plan with patient and/or surrogate as well as nursing, discussions with consultants, evaluation of patient's response to treatment, examination of patient, obtaining history from patient or surrogate, ordering and performing treatments and interventions, ordering and review of laboratory studies, ordering and review of radiographic studies, pulse oximetry and re-evaluation of patient's condition.  Medications Ordered in ED Medications  ondansetron (ZOFRAN) injection 4 mg (4 mg Intravenous Given 05/18/16 0150)  morphine 4 MG/ML injection 4 mg (4 mg Intravenous Given 05/18/16 0151)     Initial Impression / Assessment and Plan / ED Course  I have reviewed the triage vital signs and the nursing notes.  Pertinent labs & imaging results that were available during my care of the patient were reviewed by me and considered in my medical decision making (see chart for details).  Clinical Course    Pelvic pain. Pregnancy test has come back positive. Patient has history of tubal ligation, which is  confirmed on review of old records. This is clearly an ectopic pregnancy. She is hemodynamically stable. She is sent for pelvic ultrasound which does show presence of blood in the pelvis consistent with ruptured ectopic pregnancy. Case is discussed with Dr. Estanislado Pandy of GYN service who accepts the patient in transfer.  Final Clinical Impressions(s) / ED Diagnoses   Final diagnoses:  Pelvic pain affecting pregnancy in first trimester, antepartum  Ectopic pregnancy without intrauterine pregnancy, unspecified location  Anemia due to blood loss    New Prescriptions New Prescriptions   No medications on file   I personally performed the services described in this documentation, which was scribed in my presence. The recorded information has been reviewed and is accurate.       Dione Boozeavid Lasharon Dunivan, MD 05/18/16 [phone]

## 2016-05-18 NOTE — Discharge Instructions (Signed)
General Anesthesia, Adult, Care After These instructions provide you with information about caring for yourself after your procedure. Your health care provider may also give you more specific instructions. Your treatment has been planned according to current medical practices, but problems sometimes occur. Call your health care provider if you have any problems or questions after your procedure. What can I expect after the procedure? After the procedure, it is common to have:  Vomiting.  A sore throat.  Mental slowness. It is common to feel:  Nauseous.  Cold or shivery.  Sleepy.  Tired.  Sore or achy, even in parts of your body where you did not have surgery. Follow these instructions at home: For at least 24 hours after the procedure:  Do not:  Participate in activities where you could fall or become injured.  Drive.  Use heavy machinery.  Drink alcohol.  Take sleeping pills or medicines that cause drowsiness.  Make important decisions or sign legal documents.  Take care of children on your own.  Rest. Eating and drinking  If you vomit, drink water, juice, or soup when you can drink without vomiting.  Drink enough fluid to keep your urine clear or pale yellow.  Make sure you have little or no nausea before eating solid foods.  Follow the diet recommended by your health care provider. General instructions  Have a responsible adult stay with you until you are awake and alert.  Return to your normal activities as told by your health care provider. Ask your health care provider what activities are safe for you.  Take over-the-counter and prescription medicines only as told by your health care provider.  If you smoke, do not smoke without supervision.  Keep all follow-up visits as told by your health care provider. This is important. Contact a health care provider if:  You continue to have nausea or vomiting at home, and medicines are not helpful.  You  cannot drink fluids or start eating again.  You cannot urinate after 8-12 hours.  You develop a skin rash.  You have fever.  You have increasing redness at the site of your procedure. Get help right away if:  You have difficulty breathing.  You have chest pain.  You have unexpected bleeding.  You feel that you are having a life-threatening or urgent problem. This information is not intended to replace advice given to you by your health care provider. Make sure you discuss any questions you have with your health care provider. Document Released: 08/19/2000 Document Revised: 10/16/2015 Document Reviewed: 04/27/2015 Elsevier Interactive Patient Education  2017 [employer] Ectopic Pregnancy An ectopic pregnancy is when the fertilized egg attaches (implants) outside the uterus. Most ectopic pregnancies occur in one of the tubes where eggs travel from the ovary to the uterus (fallopian tubes), but the implanting can occur in other locations. In rare cases, ectopic pregnancies occur on the ovary, intestine, pelvis, abdomen, or cervix. In an ectopic pregnancy, the fertilized egg does not have the ability to develop into a normal, healthy baby. A ruptured ectopic pregnancy is one in which tearing or bursting of a fallopian tube causes internal bleeding. Often, there is intense lower abdominal pain, and vaginal bleeding sometimes occurs. Having an ectopic pregnancy can be life-threatening. If this dangerous condition is not treated, it can lead to blood loss, shock, or even death. What are the causes? The most common cause of this condition is damage to one of the fallopian tubes. A fallopian tube may be narrowed or  blocked, and that keeps the fertilized egg from reaching the uterus. What increases the risk? This condition is more likely to develop in women of childbearing age who have different levels of risk. The levels of risk can be divided into three categories. High risk  You have gone  through infertility treatment.  You have had an ectopic pregnancy before.  You have had surgery on the fallopian tubes, or another surgical procedure, such as an abortion.  You have had surgery to have the fallopian tubes tied (tubal ligation).  You have problems or diseases of the fallopian tubes.  You have been exposed to diethylstilbestrol (DES). This medicine was used until 1971, and it had effects on babies whose mothers took the medicine.  You become pregnant while using an IUD (intrauterine device) for birth control. Moderate risk  You have a history of infertility.  You have had an STI (sexually transmitted infection).  You have a history of pelvic inflammatory disease (PID).  You have scarring from endometriosis.  You have multiple sexual partners.  You smoke. Low risk  You have had pelvic surgery.  You use vaginal douches.  You became sexually active before age 37. What are the signs or symptoms? Common symptoms of this condition include normal pregnancy symptoms, such as missing a period, nausea, tiredness, abdominal pain, breast tenderness, and bleeding. However, ectopic pregnancy will have additional symptoms, such as:  Pain with intercourse.  Irregular vaginal bleeding or spotting.  Cramping or pain on one side or in the lower abdomen.  Fast heartbeat, low blood pressure, and sweating.  Passing out while having a bowel movement. Symptoms of a ruptured ectopic pregnancy and internal bleeding may include:  Sudden, severe pain in the abdomen and pelvis.  Dizziness, weakness, light-headedness, or fainting.  Pain in the shoulder or neck area. How is this diagnosed? This condition is diagnosed by:  A pelvic exam to locate pain or a mass in the abdomen.  A pregnancy test. This blood test checks for the presence as well as the specific level of pregnancy hormone in the bloodstream.  Ultrasound. This is performed if a pregnancy test is positive. In this  test, a probe is inserted into the vagina. The probe will detect a fetus, possibly in a location other than the uterus.  Taking a sample of uterus tissue (dilation and curettage, or D&C).  Surgery to perform a visual exam of the inside of the abdomen using a thin, lighted tube that has a tiny camera on the end (laparoscope).  Culdocentesis. This procedure involves inserting a needle at the top of the vagina, behind the uterus. If blood is present in this area, it may indicate that a fallopian tube is torn. How is this treated? This condition is treated with medicine or surgery. Medicine  An injection of a medicine (methotrexate) may be given to cause the pregnancy tissue to be absorbed. This medicine may save your fallopian tube. It may be given if:  The diagnosis is made early, with no signs of active bleeding.  The fallopian tube has not ruptured.  You are considered to be a good candidate for the medicine. Usually, pregnancy hormone blood levels are checked after methotrexate treatment. This is to be sure that the medicine is effective. It may take 4-6 weeks for the pregnancy to be absorbed. Most pregnancies will be absorbed by 3 weeks. Surgery  A laparoscope may be used to remove the pregnancy tissue.  If severe internal bleeding occurs, a larger cut (incision)  may be made in the lower abdomen (laparotomy) to remove the fetus and placenta. This is done to stop the bleeding.  Part or all of the fallopian tube may be removed (salpingectomy) along with the fetus and placenta. The fallopian tube may also be repaired during the surgery.  In very rare circumstances, removal of the uterus (hysterectomy) may be required.  After surgery, pregnancy hormone testing may be done to be sure that there is no pregnancy tissue left. Whether your treatment is medicine or surgery, you may receive a Rho (D) immune globulin shot to prevent problems with any future pregnancy. This shot may be given  if:  You are Rh-negative and the baby's father is Rh-positive.  You are Rh-negative and you do not know the Rh type of the baby's father. Follow these instructions at home:  Rest and limit your activity after the procedure for as long as told by your health care provider.  Until your health care provider says that it is safe:  Do not lift anything that is heavier than 10 lb (4.5 kg), or the limit that your health care provider tells you.  Avoid physical exercise and any movement that requires effort (is strenuous).  To help prevent constipation:  Eat a healthy diet that includes fruits, vegetables, and whole grains.  Drink 6-8 glasses of water per day. Get help right away if:  You develop worsening pain that is not relieved by medicine.  You have:  A fever or chills.  Vaginal bleeding.  Redness and swelling at the incision site.  Nausea and vomiting.  You feel dizzy or weak.  You feel light-headed or you faint. This information is not intended to replace advice given to you by your health care provider. Make sure you discuss any questions you have with your health care provider. Document Released: 06/20/2004 Document Revised: 01/10/2016 Document Reviewed: 12/13/2015 Elsevier Interactive Patient Education  2017 [employer]  Diagnostic Laparoscopy, Care After Introduction Refer to this sheet in the next few weeks. These instructions provide you with information about caring for yourself after your procedure. Your health care provider may also give you more specific instructions. Your treatment has been planned according to current medical practices, but problems sometimes occur. Call your health care provider if you have any problems or questions after your procedure. What can I expect after the procedure? After your procedure, it is common to have mild discomfort in the throat and abdomen. Follow these instructions at home:  Take over-the-counter and prescription  medicines only as told by your health care provider.  Do not drive for 24 hours if you received a sedative.  Return to your normal activities as told by your health care provider.  Do not take baths, swim, or use a hot tub until your health care provider approves. You may shower.  Follow instructions from your health care provider about how to take care of your incision. Make sure you:  Wash your hands with soap and water before you change your bandage (dressing). If soap and water are not available, use hand sanitizer.  Change your dressing as told by your health care provider.  Leave stitches (sutures), skin glue, or adhesive strips in place. These skin closures may need to stay in place for 2 weeks or longer. If adhesive strip edges start to loosen and curl up, you may trim the loose edges. Do not remove adhesive strips completely unless your health care provider tells you to do that.  Check your incision  area every day for signs of infection. Check for:  More redness, swelling, or pain.  More fluid or blood.  Warmth.  Pus or a bad smell.  It is your responsibility to get the results of your procedure. Ask your health care provider or the department performing the procedure when your results will be ready. Contact a health care provider if:  There is new pain in your shoulders.  You feel light-headed or faint.  You are unable to pass gas or unable to have a bowel movement.  You feel nauseous or you vomit.  You develop a rash.  You have more redness, swelling, or pain around your incision.  You have more fluid or blood coming from your incision.  Your incision feels warm to the touch.  You have pus or a bad smell coming from your incision.  You have a fever or chills. Get help right away if:  Your pain is getting worse.  You have ongoing vomiting.  The edges of your incision open up.  You have trouble breathing.  You have chest pain. This information is  not intended to replace advice given to you by your health care provider. Make sure you discuss any questions you have with your health care provider. Document Released: 04/24/2015 Document Revised: 10/19/2015 Document Reviewed: 01/24/2015  2017 Elsevier

## 2016-05-18 NOTE — Anesthesia Preprocedure Evaluation (Signed)
Anesthesia Evaluation  Patient identified by MRN, date of birth, ID band Patient awake    Reviewed: Allergy & Precautions, H&P , Patient's Chart, lab work & pertinent test results, reviewed documented beta blocker date and time   Airway Mallampati: II  TM Distance: >3 FB Neck ROM: full    Dental no notable dental hx.    Pulmonary Current Smoker,    Pulmonary exam normal breath sounds clear to auscultation       Cardiovascular  Rhythm:regular Rate:Normal     Neuro/Psych    GI/Hepatic   Endo/Other    Renal/GU      Musculoskeletal   Abdominal   Peds  Hematology   Anesthesia Other Findings   Reproductive/Obstetrics                             Anesthesia Physical Anesthesia Plan  ASA: II and emergent  Anesthesia Plan: General   Post-op Pain Management:    Induction: Intravenous  Airway Management Planned: Oral ETT  Additional Equipment:   Intra-op Plan:   Post-operative Plan: Extubation in OR  Informed Consent: I have reviewed the patients History and Physical, chart, labs and discussed the procedure including the risks, benefits and alternatives for the proposed anesthesia with the patient or authorized representative who has indicated his/her understanding and acceptance.   Dental Advisory Given and Dental advisory given  Plan Discussed with: CRNA and Surgeon  Anesthesia Plan Comments: (  Discussed general anesthesia, including possible nausea, instrumentation of airway, sore throat,pulmonary aspiration, etc. I asked if the were any outstanding questions, or  concerns before we proceeded.)        Anesthesia Quick Evaluation

## 2016-05-18 NOTE — Transfer of Care (Signed)
Immediate Anesthesia Transfer of Care Note  Patient: [name]  Procedure(s) Performed: Procedure(s): DIAGNOSTIC LAPAROSCOPY WITH LEFT SALPINGECTOMY (N/A)  Patient Location: PACU  Anesthesia Type:General  Level of Consciousness: awake  Airway & Oxygen Therapy: Patient Spontanous Breathing and Patient connected to nasal cannula oxygen  Post-op Assessment: Report given to RN and Post -op Vital signs reviewed and stable  Post vital signs: stable  Last Vitals:  Vitals:   05/18/16 0253 05/18/16 0346  BP: 114/67 122/68  Pulse: 75 88  Resp:  18  Temp:  36.9 C    Last Pain:  Vitals:   05/18/16 0345  TempSrc:   PainSc: 10-Worst pain ever      Patients Stated Pain Goal: 0 (05/18/16 0345)  Complications: No apparent anesthesia complications

## 2016-05-18 NOTE — ED Notes (Signed)
[facility] arrived for transport 

## 2016-05-18 NOTE — Op Note (Signed)
[name] PROCEDURE DATE: 05/18/2016  PREOPERATIVE DIAGNOSIS: Ruptured ectopic pregnancy POSTOPERATIVE DIAGNOSIS: Ruptured left fallopian tube ectopic pregnancy PROCEDURE: Laparoscopic left salpingectomy and removal of ectopic pregnancy SURGEON:  Dr. Catalina AntiguaPeggy Charle [name] ASSISTANT: none ANESTHESIOLOGIST: Savannah BlueKyle Jackson, MD Anesthesiologist: Savannah BlueKyle Jackson, MD CRNA: Savannah DillsJanet L Mullins, CRNA  INDICATIONS: 35 y.o. No obstetric history on file. at Unknown here for with ruptured ectopic pregnancy. On exam, she had stable vital signs, and an acute abdomen. Hgb  Lab Results  Component Value Date   HGB 10.4 (L) 05/17/2016   , blood type O POS . Patient was counseled regarding need for laparoscopic salpingectomy. Risks of surgery including bleeding which may require transfusion or reoperation, infection, injury to bowel or other surrounding organs, need for additional procedures including laparotomy and other postoperative/anesthesia complications were explained to patient.  Written informed consent was obtained.  FINDINGS:  moderate amount of hemoperitoneum estimated to be about 250 of blood and clots.  Dilated left fallopian tube containing ectopic gestation. Small normal appearing uterus, normal right fallopian tube with evidence of previous transection, right ovary and left ovary. Omental adhesions at the umbilicus which did not obstruct the view into the pelvis  ANESTHESIA: General INTRAVENOUS FLUIDS: .1500 ml ESTIMATED BLOOD LOSS: 250 ml URINE OUTPUT: 350 ml SPECIMENS: left fallopian tube containing ectopic gestation COMPLICATIONS: None immediate  PROCEDURE IN DETAIL:  The patient was taken to the operating room where general anesthesia was administered and was found to be adequate.  She was placed in the dorsal lithotomy position, and was prepped and draped in a sterile manner.  A Foley catheter was inserted into her bladder and attached to Webber Michiels drainage and a uterine manipulator was then  advanced into the uterus .  After an adequate timeout was performed, attention was then turned to the patient's abdomen where a 10-mm skin incision was made on the umbilical fold.  The fascia was identified, tented up and incised with Mayo scissors. The fascia was tagged with 0- Vicryl. The peritoneum was identified tented up and entered sharply with Metzenbaum scissors.  10-mm trocar and sleeve were then advanced without difficulty into the abdomen where intraabdominal placement was confirmed by the laparoscope. Pneumoperitoneum was achieved with insufflation of CO2 gas. A survey of the patient's pelvis and abdomen revealed the findings as above.  Bilateral lower quadrant ports were placed under direct visualization; 5-mm on the left and 5-mm on the right.  The 5-mm Nezhat suction irrigator was then used to suction the hemoperitoneum and irrigate the pelvis.  Attention was then turned to the left fallopian tube which was grasped and ligated from the underlying mesosalpinx and uterine attachment using the Enseal instrument.  Good hemostasis was noted.  The specimen was placed in an EndoCatch bag and removed from the abdomen intact.  The abdomen was desufflated, and all instruments were removed.  The fascial incisions of both 10-mm sites were reapproximated with 0 Vicryl figure-of-eight stiches; and all skin incisions were closed with a 3-0 Vicryl subcuticular stitch. The patient tolerated the procedures well.  All instruments, needles, and sponge counts were correct x 2. The patient was taken to the recovery room in stable condition.   The patient will be discharged to home as per PACU criteria.  Routine postoperative instructions given.  She was prescribed Percocet, Ibuprofen and Colace.  She will follow up in the clinic in 2 weeks for postoperative evaluation .

## 2016-05-18 NOTE — MAU Note (Signed)
Dr Jolayne Pantheronstant in to see pt and discuss surgery. Consent signed

## 2016-05-18 NOTE — MAU Note (Signed)
Dr Cristela BlueKyle Jackson in to see pt

## 2016-05-18 NOTE — Progress Notes (Signed)
Pt c/o LLQ abd pain radiating to mid low abd x 2 weeks worse yesterday and today. Intermittent nausea, pt denies vag bleeding/discharge, pt denies urinary s/s.  Pt does have irregular periods, last period ?October, pt had tubal ligation 2007. Abd tender LLQ.  IV est, labs drawn, pt medicated per MD order then to U/S

## 2016-05-18 NOTE — MAU Note (Signed)
[facility] with ruptured ectopic

## 2016-05-18 NOTE — H&P (Signed)
[name] is an 35 y.o. female G2P2 transferred from [place] for the treatment of an ectopic pregnancy. Patient reports onset of LLQ pain approximately 10 days ago which has gotten progressively worst. The pain radiates to her RLQ and is associated with some diarrhea. Her pain is worst with movement and palpation. She had a BTL in 2007 at the time of her cesarean section. She denies chest pain, SOB, lightheadedness/dizziness.  Pertinent Gynecological History: Menses: regular every month without intermenstrual spotting Contraception: tubal ligation DES exposure: denies Blood transfusions: none Sexually transmitted diseases: no past history Previous GYN Procedures: DNC  OB History: G2, P2   Menstrual History: No LMP recorded.    History reviewed. No pertinent past medical history.  Past Surgical History:  Procedure Laterality Date  . CESAREAN SECTION    . DILATION AND CURETTAGE OF UTERUS    . LAPAROSCOPIC APPENDECTOMY  12/05/2011   Procedure: APPENDECTOMY LAPAROSCOPIC;  Surgeon: Ardeth Sportsman, MD;  Location: [facility];  Service: General;  Laterality: N/A;  . TUBAL LIGATION      History reviewed. No pertinent family history.  Social History:  reports that she has been smoking.  She has a 4.00 pack-year smoking history. She has never used smokeless tobacco. She reports that she drinks alcohol. She reports that she does not use drugs.  Allergies: No Known Allergies  Prescriptions Prior to Admission  Medication Sig Dispense Refill Last Dose  . psyllium (HYDROCIL/METAMUCIL) 95 % PACK Take 1 packet by mouth 2 (two) times daily as needed. 56 each      ROS See pertinent in HPI Blood pressure 122/68, pulse 88, temperature 98.4 F (36.9 C), resp. rate 18, height 5\' 4"  (1.626 m), weight 203 lb (92.1 kg), SpO2 100 %. Physical Exam GENERAL: Well-developed, well-nourished female in no acute distress.  HEENT: Normocephalic, atraumatic. Sclerae anicteric.  NECK: Supple. Normal  thyroid.  LUNGS: Clear to auscultation bilaterally.  HEART: Regular rate and rhythm. ABDOMEN: Soft, nondistended, tenderness in lower abdomen, positive guarding PELVIC: deferred to OR EXTREMITIES: No cyanosis, clubbing, or edema, 2+ distal pulses.  Results for orders placed or performed during the hospital encounter of 05/18/16 (from the past 24 hour(s))  Lipase, blood     Status: None   Collection Time: 05/17/16 10:21 PM  Result Value Ref Range   Lipase 19 11 - 51 U/L  Comprehensive metabolic panel     Status: Abnormal   Collection Time: 05/17/16 10:21 PM  Result Value Ref Range   Sodium 137 135 - 145 mmol/L   Potassium 3.2 (L) 3.5 - 5.1 mmol/L   Chloride 109 101 - 111 mmol/L   CO2 20 (L) 22 - 32 mmol/L   Glucose, Bld 97 65 - 99 mg/dL   BUN 9 6 - 20 mg/dL   Creatinine, Ser 4.09 0.44 - 1.00 mg/dL   Calcium 8.8 (L) 8.9 - 10.3 mg/dL   Total Protein 6.6 6.5 - 8.1 g/dL   Albumin 3.8 3.5 - 5.0 g/dL   AST 16 15 - 41 U/L   ALT 23 14 - 54 U/L   Alkaline Phosphatase 50 38 - 126 U/L   Total Bilirubin 0.3 0.3 - 1.2 mg/dL   GFR calc non Af Amer >60 >60 mL/min   GFR calc Af Amer >60 >60 mL/min   Anion gap 8 5 - 15  CBC     Status: Abnormal   Collection Time: 05/17/16 10:21 PM  Result Value Ref Range   WBC 11.7 (H) 4.0 -  10.5 K/uL   RBC 3.68 (L) 3.87 - 5.11 MIL/uL   Hemoglobin 10.4 (L) 12.0 - 15.0 g/dL   HCT 16.131.9 (L) 09.636.0 - 04.546.0 %   MCV 86.7 78.0 - 100.0 fL   MCH 28.3 26.0 - 34.0 pg   MCHC 32.6 30.0 - 36.0 g/dL   RDW 40.913.3 81.111.5 - 91.415.5 %   Platelets 269 150 - 400 K/uL  Urinalysis, Routine w reflex microscopic     Status: Abnormal   Collection Time: 05/17/16 10:22 PM  Result Value Ref Range   Color, Urine YELLOW YELLOW   APPearance CLEAR CLEAR   Specific Gravity, Urine 1.024 1.005 - 1.030   pH 5.0 5.0 - 8.0   Glucose, UA NEGATIVE NEGATIVE mg/dL   Hgb urine dipstick NEGATIVE NEGATIVE   Bilirubin Urine NEGATIVE NEGATIVE   Ketones, ur 5 (A) NEGATIVE mg/dL   Protein, ur NEGATIVE  NEGATIVE mg/dL   Nitrite NEGATIVE NEGATIVE   Leukocytes, UA TRACE (A) NEGATIVE   RBC / HPF 0-5 0 - 5 RBC/hpf   WBC, UA 6-30 0 - 5 WBC/hpf   Bacteria, UA NONE SEEN NONE SEEN   Squamous Epithelial / LPF 0-5 (A) NONE SEEN   Mucous PRESENT   I-Stat beta hCG blood, ED     Status: Abnormal   Collection Time: 05/17/16 10:35 PM  Result Value Ref Range   I-stat hCG, quantitative 1,867.4 (H) <5 mIU/mL   Comment 3          Type and screen  [facility]     Status: None   Collection Time: 05/18/16  1:36 AM  Result Value Ref Range   ABO/RH(D) O POS    Antibody Screen NEG    Sample Expiration 05/21/2016   ABO/Rh     Status: None (Preliminary result)   Collection Time: 05/18/16  1:36 AM  Result Value Ref Range   ABO/RH(D) O POS   I-Stat CG4 Lactic Acid, ED     Status: None   Collection Time: 05/18/16  1:50 AM  Result Value Ref Range   Lactic Acid, Venous 0.82 0.5 - 1.9 mmol/L  hCG, quantitative, pregnancy     Status: Abnormal   Collection Time: 05/18/16  1:57 AM  Result Value Ref Range   hCG, Beta Chain, Quant, S 2,546 (H) <5 mIU/mL    [place] Ob Comp Less 14 Wks  Result Date: 05/18/2016 CLINICAL DATA:  Left-sided pelvic pain. History of tubal ligation. Positive pregnancy test with quantitative HCG 1867 EXAM: OBSTETRIC <14 WK US AND TRANSVAGINAL OB US TECHNIQUE: Both transabdominal and transvaginal ultrasound examinations were performed for complete evaluation of the gestation as well as the maternal uterus, adnexal regions, and pelvic cul-de-sac. Transvaginal technique was performed to assess early pregnancy. COMPARISON:  None. FINDINGS: Intrauterine gestational sac: Not visible Yolk sac:  Not visible Embryo:  Not visible Cardiac Activity: Not visible Heart Rate:   bpm MSD:   mm    w     d CRL:    mm    w    d                  US EDC: Subchorionic hemorrhage:  None visualized. Maternal uterus/adnexae: Left ovary appears normal. Complex left paraovarian mass measuring 3.4 x 4.5 x 7.0  cm. Within this, there is a hypoechoic 2 cm focus which could represent a gestational sac. There is a moderate volume of blood or complex fluid in the pelvis. Right ovary and right adnexal structures  appear normal. Endometrium is homogeneous and thickened, 17 mm. No intrauterine sac. IMPRESSION: Findings are highly suspicious for a left parovarian ectopic gestation, and moderate volume blood in the pelvis. These results were called by telephone at the time of interpretation on 05/18/2016 at 2:45 am to Dr. Dione BoozeAVID GLICK , who verbally acknowledged these results. Electronically Signed   By: Ellery Plunkaniel R Mitchell M.D.   On: 05/18/2016 02:49   [place] Ob Transvaginal  Result Date: 05/18/2016 CLINICAL DATA:  Left-sided pelvic pain. History of tubal ligation. Positive pregnancy test with quantitative HCG 1867 EXAM: OBSTETRIC <14 WK US AND TRANSVAGINAL OB US TECHNIQUE: Both transabdominal and transvaginal ultrasound examinations were performed for complete evaluation of the gestation as well as the maternal uterus, adnexal regions, and pelvic cul-de-sac. Transvaginal technique was performed to assess early pregnancy. COMPARISON:  None. FINDINGS: Intrauterine gestational sac: Not visible Yolk sac:  Not visible Embryo:  Not visible Cardiac Activity: Not visible Heart Rate:   bpm MSD:   mm    w     d CRL:    mm    w    d                  US EDC: Subchorionic hemorrhage:  None visualized. Maternal uterus/adnexae: Left ovary appears normal. Complex left paraovarian mass measuring 3.4 x 4.5 x 7.0 cm. Within this, there is a hypoechoic 2 cm focus which could represent a gestational sac. There is a moderate volume of blood or complex fluid in the pelvis. Right ovary and right adnexal structures appear normal. Endometrium is homogeneous and thickened, 17 mm. No intrauterine sac. IMPRESSION: Findings are highly suspicious for a left parovarian ectopic gestation, and moderate volume blood in the pelvis. These results were called by  telephone at the time of interpretation on 05/18/2016 at 2:45 am to Dr. Dione BoozeAVID GLICK , who verbally acknowledged these results. Electronically Signed   By: Ellery Plunkaniel R Mitchell M.D.   On: 05/18/2016 02:49    Assessment/Plan: 35 yo with an ectopic pregnancy - Discussed treatment with laparoscopic salpingectomy. Risks, benefits and alternatives were explained including but not limited to risks of bleeding, infection and damage to adjacent organs. Patient verbalized understanding and all questions were answered. Consent signed  Hasson Gaspard 05/18/2016, 4:09 AM

## 2016-05-18 NOTE — Anesthesia Procedure Notes (Addendum)
Procedure Name: Intubation Performed by: Renford DillsMULLINS, Makinzie Considine L Pre-anesthesia Checklist: Patient identified, Emergency Drugs available, Suction available and Patient being monitored Patient Re-evaluated:Patient Re-evaluated prior to inductionOxygen Delivery Method: Circle system utilized Preoxygenation: Pre-oxygenation with 100% oxygen Intubation Type: IV induction and Rapid sequence Laryngoscope Size: Miller and 2 Grade View: Grade II Tube type: Oral Tube size: 7.0 mm Number of attempts: 1 Airway Equipment and Method: Stylet Placement Confirmation: ETT inserted through vocal cords under direct vision,  positive ETCO2 and breath sounds checked- equal and bilateral Secured at: 21 cm Tube secured with: Tape Dental Injury: Teeth and Oropharynx as per pre-operative assessment

## 2016-05-18 NOTE — Anesthesia Postprocedure Evaluation (Signed)
Anesthesia Post Note  Patient: [name]  Procedure(s) Performed: Procedure(s) (LRB): DIAGNOSTIC LAPAROSCOPY WITH LEFT SALPINGECTOMY (N/A)  Patient location during evaluation: PACU Anesthesia Type: General Level of consciousness: awake and alert and oriented Pain management: pain level controlled Vital Signs Assessment: post-procedure vital signs reviewed and stable Respiratory status: spontaneous breathing, nonlabored ventilation and respiratory function stable Cardiovascular status: blood pressure returned to baseline and stable Postop Assessment: no signs of nausea or vomiting Anesthetic complications: no        Last Vitals:  Vitals:   05/18/16 0645 05/18/16 0700  BP: 127/75 127/79  Pulse: 90 95  Resp: 17 20  Temp:  36.9 C    Last Pain:  Vitals:   05/18/16 0700  TempSrc:   PainSc: 6    Pain Goal: Patients Stated Pain Goal: 0 (05/18/16 0345)               Anusha Claus A.

## 2016-05-18 NOTE — ED Notes (Signed)
Last food, 2200 last night. Report called to [facility], [facility] requested.

## 2016-05-21 ENCOUNTER — Encounter (HOSPITAL_COMMUNITY): Payer: Self-pay | Admitting: Obstetrics and Gynecology

## 2016-05-21 ENCOUNTER — Encounter: Payer: Self-pay | Admitting: *Deleted

## 2016-05-22 ENCOUNTER — Other Ambulatory Visit: Payer: Self-pay

## 2016-05-22 MED ORDER — OXYCODONE-ACETAMINOPHEN 5-325 MG PO TABS: 1.0000 | ORAL_TABLET | ORAL | 0 refills | Status: DC | PRN

## 2016-05-22 NOTE — Telephone Encounter (Signed)
Patient called in stating that she is in a lot of pain after her procedure despite taking ibuprofen, wants a refill on oxycodone, routed to provider for review.

## 2016-06-03 ENCOUNTER — Encounter: Payer: Medicaid Other | Admitting: Obstetrics and Gynecology

## 2017-07-14 ENCOUNTER — Encounter (HOSPITAL_COMMUNITY): Payer: Self-pay

## 2017-07-14 ENCOUNTER — Emergency Department (HOSPITAL_COMMUNITY)
Admission: EM | Admit: 2017-07-14 | Discharge: 2017-07-14 | Disposition: A | Discharge status: Home / Self Care | Payer: Medicaid Other | Attending: Emergency Medicine | Admitting: Emergency Medicine

## 2017-07-14 ENCOUNTER — Other Ambulatory Visit: Payer: Self-pay

## 2017-07-14 ENCOUNTER — Emergency Department (HOSPITAL_COMMUNITY): Payer: Medicaid Other

## 2017-07-14 DIAGNOSIS — Z79899 Other long term (current) drug therapy: Secondary | ICD-10-CM | POA: Insufficient documentation

## 2017-07-14 DIAGNOSIS — F17210 Nicotine dependence, cigarettes, uncomplicated: Secondary | ICD-10-CM | POA: Insufficient documentation

## 2017-07-14 DIAGNOSIS — J111 Influenza due to unidentified influenza virus with other respiratory manifestations: Secondary | ICD-10-CM | POA: Insufficient documentation

## 2017-07-14 DIAGNOSIS — R69 Illness, unspecified: Secondary | ICD-10-CM

## 2017-07-14 LAB — I-STAT BETA HCG BLOOD, ED (MC, WL, AP ONLY): I-stat hCG, quantitative: <5 m[IU]/mL (ref <5)

## 2017-07-14 LAB — BASIC METABOLIC PANEL
Anion gap: 9 (ref 5–15)
BUN: 9 mg/dL (ref 6–20)
CHLORIDE: 106 mmol/L (ref 101–111)
CO2: 21 mmol/L — ABNORMAL LOW (ref 22–32)
CREATININE: 0.76 mg/dL (ref 0.44–1.00)
Calcium: 8.8 mg/dL — ABNORMAL LOW (ref 8.9–10.3)
GFR calc Af Amer: >60 mL/min (ref >60)
Glucose, Bld: 136 mg/dL — ABNORMAL HIGH (ref 65–99)
Potassium: 4.1 mmol/L (ref 3.5–5.1)
SODIUM: 136 mmol/L (ref 135–145)

## 2017-07-14 LAB — CBC
HCT: 40.8 % (ref 36.0–46.0)
Hemoglobin: 13.4 g/dL (ref 12.0–15.0)
MCH: 28.8 pg (ref 26.0–34.0)
MCHC: 32.8 g/dL (ref 30.0–36.0)
MCV: 87.6 fL (ref 78.0–100.0)
PLATELETS: 242 10*3/uL (ref 150–400)
RBC: 4.66 MIL/uL (ref 3.87–5.11)
RDW: 14.1 % (ref 11.5–15.5)
WBC: 9.2 10*3/uL (ref 4.0–10.5)

## 2017-07-14 LAB — I-STAT TROPONIN, ED: TROPONIN I, POC: 0 ng/mL (ref 0.00–0.08)

## 2017-07-14 NOTE — Discharge Instructions (Signed)
It was our pleasure to provide your ER care today - we hope that you feel better.  Your lab and xray results look good - your symptoms are likely the result of a flu-like illness.   Rest. Drink plenty of fluids.  Take acetaminophen and/or ibuprofen as need.  Try mucinex or similar other the counter cold/flu medication as need for symptom relief.  Follow up with primary care doctor in 1 week if symptoms fail to improve/resolve.  Return to ER if worse, new symptoms, trouble breathing, other concern.

## 2017-07-14 NOTE — ED Triage Notes (Signed)
Pt presents to the ed with complaints of a few days of flu like symptoms along with chest pain, shortness of breath and a dry cough. NAD in triage.

## 2017-07-14 NOTE — ED Provider Notes (Signed)
[facility]   CSN: [id number] Arrival date & time: 07/14/17  1253     History   Chief Complaint Chief Complaint  Patient presents with  . Chest Pain  . Influenza    HPI [name] is a 37 y.o. female.  Patient c/o non prod cough, congestion, body aches, subj fever for the past few days. Symptoms moderate, persistent. Recent ill contacts w others w uri symptoms. No episodic or exertional chest pain although reports soreness w coughing. Denies leg pain or swelling. No severe headaches. No neck pain/stiffness.    The history is provided by the patient.  Chest Pain   Associated symptoms include cough and a fever. Pertinent negatives include no abdominal pain, no headaches and no shortness of breath.  Influenza  Presenting symptoms: cough, fever and rhinorrhea   Presenting symptoms: no headaches and no shortness of breath   Associated symptoms: nasal congestion   Associated symptoms: no neck stiffness     History reviewed. No pertinent past medical history.  Patient Active Problem List   Diagnosis Date Noted  . Left tubal pregnancy without intrauterine pregnancy   . Ectopic pregnancy without intrauterine pregnancy   . Acute appendicitis 12/06/2011    Past Surgical History:  Procedure Laterality Date  . CESAREAN SECTION    . DIAGNOSTIC LAPAROSCOPY WITH REMOVAL OF ECTOPIC PREGNANCY N/A 05/18/2016   Procedure: DIAGNOSTIC LAPAROSCOPY WITH LEFT SALPINGECTOMY;  Surgeon: Catalina AntiguaPeggy Constant, MD;  Location: [facility];  Service: Gynecology;  Laterality: N/A;  . DILATION AND CURETTAGE OF UTERUS    . LAPAROSCOPIC APPENDECTOMY  12/05/2011   Procedure: APPENDECTOMY LAPAROSCOPIC;  Surgeon: Ardeth SportsmanSteven C. Gross, MD;  Location: [facility];  Service: General;  Laterality: N/A;  . TUBAL LIGATION      OB History    No data available       Home Medications    Prior to Admission medications   Medication Sig Start Date End Date Taking?  Authorizing Provider  docusate sodium (COLACE) 100 MG capsule Take 1 capsule (100 mg total) by mouth 2 (two) times daily as needed. 05/18/16   Constant, Peggy, MD  ibuprofen (ADVIL,MOTRIN) 600 MG tablet Take 1 tablet (600 mg total) by mouth every 6 (six) hours as needed. 05/18/16   Constant, Peggy, MD  oxyCODONE-acetaminophen (PERCOCET/ROXICET) 5-325 MG tablet Take 1 tablet by mouth every 4 (four) hours as needed. 05/22/16   Hermina StaggersErvin, Michael L, MD  psyllium (HYDROCIL/METAMUCIL) 95 % PACK Take 1 packet by mouth 2 (two) times daily as needed. 12/06/11   Sherrie GeorgeJennings, Willard, PA-C    Family History No family history on file.  Social History Social History   Tobacco Use  . Smoking status: Current Every Day Smoker    Packs/day: 0.50    Years: 8.00    Pack years: 4.00  . Smokeless tobacco: Never Used  Substance Use Topics  . Alcohol use: Yes    Alcohol/week: 0.0 oz    Comment: 6 pack of beer per week  . Drug use: No     Allergies   Patient has no known allergies.   Review of Systems Review of Systems  Constitutional: Positive for fever.  HENT: Positive for congestion and rhinorrhea.   Eyes: Negative for redness.  Respiratory: Positive for cough. Negative for shortness of breath.   Cardiovascular: Positive for chest pain.  Gastrointestinal: Negative for abdominal pain.  Genitourinary: Negative for flank pain.  Musculoskeletal: Negative for neck pain and neck stiffness.  Skin:  Negative for rash.  Neurological: Negative for headaches.  Hematological: Does not bruise/bleed easily.  Psychiatric/Behavioral: Negative for confusion.     Physical Exam Updated Vital Signs BP 140/89   Pulse 92   Temp 98.6 F (37 C)   Resp 18   Wt 92.1 kg (203 lb)   LMP 06/13/2017 (Within Weeks) Comment: Irregular periods  SpO2 100%   BMI 34.84 kg/m   Physical Exam  Constitutional: She appears well-developed and well-nourished. No distress.  HENT:  Mouth/Throat: Oropharynx is clear and moist.    Eyes: Conjunctivae are normal. No scleral icterus.  Neck: Neck supple. No tracheal deviation present.  No stiffness or rigidity  Cardiovascular: Normal rate, regular rhythm, normal heart sounds and intact distal pulses. Exam reveals no gallop and no friction rub.  No murmur heard. Pulmonary/Chest: Effort normal and breath sounds normal. No respiratory distress. She exhibits no tenderness.  Abdominal: Soft. Normal appearance and bowel sounds are normal. She exhibits no distension. There is no tenderness.  Genitourinary:  Genitourinary Comments: No cva tenderness  Musculoskeletal: She exhibits no edema or tenderness.  Neurological: She is alert.  Skin: Skin is warm and dry. No rash noted.  Psychiatric: She has a normal mood and affect.  Nursing note and vitals reviewed.    ED Treatments / Results  Labs (all labs ordered are listed, but only abnormal results are displayed) Results for orders placed or performed during the hospital encounter of 07/14/17  Basic metabolic panel  Result Value Ref Range   Sodium 136 135 - 145 mmol/L   Potassium 4.1 3.5 - 5.1 mmol/L   Chloride 106 101 - 111 mmol/L   CO2 21 (L) 22 - 32 mmol/L   Glucose, Bld 136 (H) 65 - 99 mg/dL   BUN 9 6 - 20 mg/dL   Creatinine, Ser 1.61 0.44 - 1.00 mg/dL   Calcium 8.8 (L) 8.9 - 10.3 mg/dL   GFR calc non Af Amer >60 >60 mL/min   GFR calc Af Amer >60 >60 mL/min   Anion gap 9 5 - 15  CBC  Result Value Ref Range   WBC 9.2 4.0 - 10.5 K/uL   RBC 4.66 3.87 - 5.11 MIL/uL   Hemoglobin 13.4 12.0 - 15.0 g/dL   HCT 09.6 04.5 - 40.9 %   MCV 87.6 78.0 - 100.0 fL   MCH 28.8 26.0 - 34.0 pg   MCHC 32.8 30.0 - 36.0 g/dL   RDW 81.1 91.4 - 78.2 %   Platelets 242 150 - 400 K/uL  I-stat troponin, ED  Result Value Ref Range   Troponin i, poc 0.00 0.00 - 0.08 ng/mL   Comment 3          I-Stat beta hCG blood, ED  Result Value Ref Range   I-stat hCG, quantitative <5.0 <5 mIU/mL   Comment 3           No results found.  EKG   EKG Interpretation  Date/Time:  Monday July 14 2017 13:01:15 EST Ventricular Rate:  95 PR Interval:  120 QRS Duration: 70 QT Interval:  358 QTC Calculation: 449 R Axis:   70 Text Interpretation:  Normal sinus rhythm Normal ECG Confirmed by Cathren Laine ([id number]) on 07/14/2017 1:33:35 PM       Radiology Dg Chest 2 View  Result Date: 07/14/2017 CLINICAL DATA:  Chest pain and shortness of breath, cough beginning last night. EXAM: CHEST  2 VIEW COMPARISON:  Chest radiograph March 24, 2011 FINDINGS: Cardiomediastinal silhouette is normal.  No pleural effusions or focal consolidations. Mild bronchitic changes. Trachea projects midline and there is no pneumothorax. Soft tissue planes and included osseous structures are non-suspicious. IMPRESSION: Mild bronchitic changes without focal consolidation. Electronically Signed   By: Awilda Metro M.D.   On: 07/14/2017 14:50    Procedures Procedures (including critical care time)  Medications Ordered in ED Medications - No data to display   Initial Impression / Assessment and Plan / ED Course  I have reviewed the triage vital signs and the nursing notes.  Pertinent labs & imaging results that were available during my care of the patient were reviewed by me and considered in my medical decision making (see chart for details).  Cxr.  Reviewed nursing notes and prior charts for additional history.   Reviewed labs - lytes normal, no acute process.   cxr reviewed - no acute pna.   Patients symptoms and exam appear most c/w viral uri or flu like illness.  Patient currently appears stable for d/c.     Final Clinical Impressions(s) / ED Diagnoses   Final diagnoses:  None    ED Discharge Orders    None       Cathren Laine, MD 07/14/17 1459

## 2017-10-29 ENCOUNTER — Ambulatory Visit: Payer: Medicaid Other | Admitting: Obstetrics and Gynecology

## 2018-04-29 ENCOUNTER — Ambulatory Visit: Payer: Medicaid Other | Admitting: Certified Nurse Midwife

## 2018-05-21 ENCOUNTER — Ambulatory Visit: Payer: Medicaid Other | Admitting: Certified Nurse Midwife

## 2018-10-07 ENCOUNTER — Ambulatory Visit: Payer: Self-pay

## 2018-10-07 ENCOUNTER — Encounter (HOSPITAL_COMMUNITY): Payer: Self-pay | Admitting: Emergency Medicine

## 2018-10-07 ENCOUNTER — Emergency Department (HOSPITAL_COMMUNITY): Payer: 59

## 2018-10-07 ENCOUNTER — Other Ambulatory Visit: Payer: Self-pay

## 2018-10-07 ENCOUNTER — Emergency Department (HOSPITAL_COMMUNITY)
Admission: EM | Admit: 2018-10-07 | Discharge: 2018-10-07 | Disposition: A | Discharge status: Home / Self Care | Payer: 59 | Attending: Emergency Medicine | Admitting: Emergency Medicine

## 2018-10-07 DIAGNOSIS — A5901 Trichomonal vulvovaginitis: Secondary | ICD-10-CM | POA: Insufficient documentation

## 2018-10-07 DIAGNOSIS — R10815 Periumbilic abdominal tenderness: Secondary | ICD-10-CM | POA: Insufficient documentation

## 2018-10-07 DIAGNOSIS — F17210 Nicotine dependence, cigarettes, uncomplicated: Secondary | ICD-10-CM | POA: Diagnosis not present

## 2018-10-07 DIAGNOSIS — R102 Pelvic and perineal pain: Secondary | ICD-10-CM | POA: Diagnosis present

## 2018-10-07 DIAGNOSIS — N739 Female pelvic inflammatory disease, unspecified: Secondary | ICD-10-CM | POA: Insufficient documentation

## 2018-10-07 DIAGNOSIS — N730 Acute parametritis and pelvic cellulitis: Secondary | ICD-10-CM

## 2018-10-07 LAB — WET PREP, GENITAL
Clue Cells Wet Prep HPF POC: NONE SEEN
Yeast Wet Prep HPF POC: NONE SEEN

## 2018-10-07 LAB — URINALYSIS, ROUTINE W REFLEX MICROSCOPIC
Bilirubin Urine: NEGATIVE
Glucose, UA: NEGATIVE mg/dL
Hgb urine dipstick: NEGATIVE
Ketones, ur: NEGATIVE mg/dL
Leukocytes,Ua: NEGATIVE
Nitrite: NEGATIVE
Protein, ur: NEGATIVE mg/dL
Specific Gravity, Urine: 1.02 (ref 1.005–1.030)
pH: 6.5 (ref 5.0–8.0)

## 2018-10-07 LAB — CBC WITH DIFFERENTIAL/PLATELET
Abs Immature Granulocytes: 0.07 10*3/uL (ref 0.00–0.07)
Basophils Absolute: 0.1 10*3/uL (ref 0.0–0.1)
Basophils Relative: 1 %
Eosinophils Absolute: 0.3 10*3/uL (ref 0.0–0.5)
Eosinophils Relative: 4 %
HCT: 39.4 % (ref 36.0–46.0)
Hemoglobin: 12.6 g/dL (ref 12.0–15.0)
Immature Granulocytes: 1 %
Lymphocytes Relative: 39 %
Lymphs Abs: 3.4 10*3/uL (ref 0.7–4.0)
MCH: 28.6 pg (ref 26.0–34.0)
MCHC: 32 g/dL (ref 30.0–36.0)
MCV: 89.5 fL (ref 80.0–100.0)
Monocytes Absolute: 0.5 10*3/uL (ref 0.1–1.0)
Monocytes Relative: 6 %
Neutro Abs: 4.4 10*3/uL (ref 1.7–7.7)
Neutrophils Relative %: 49 %
Platelets: 233 10*3/uL (ref 150–400)
RBC: 4.4 MIL/uL (ref 3.87–5.11)
RDW: 14 % (ref 11.5–15.5)
WBC: 8.8 10*3/uL (ref 4.0–10.5)
nRBC: 0 % (ref 0.0–0.2)

## 2018-10-07 LAB — COMPREHENSIVE METABOLIC PANEL
ALT: 39 U/L (ref 0–44)
AST: 24 U/L (ref 15–41)
Albumin: 3.9 g/dL (ref 3.5–5.0)
Alkaline Phosphatase: 77 U/L (ref 38–126)
Anion gap: 11 (ref 5–15)
BUN: 9 mg/dL (ref 6–20)
CO2: 23 mmol/L (ref 22–32)
Calcium: 8.9 mg/dL (ref 8.9–10.3)
Chloride: 105 mmol/L (ref 98–111)
Creatinine, Ser: 0.74 mg/dL (ref 0.44–1.00)
GFR calc Af Amer: >60 mL/min (ref >60)
GFR calc non Af Amer: >60 mL/min (ref >60)
Glucose, Bld: 101 mg/dL — ABNORMAL HIGH (ref 70–99)
Potassium: 4 mmol/L (ref 3.5–5.1)
Sodium: 139 mmol/L (ref 135–145)
Total Bilirubin: 0.5 mg/dL (ref 0.3–1.2)
Total Protein: 7.2 g/dL (ref 6.5–8.1)

## 2018-10-07 LAB — PREGNANCY, URINE: Preg Test, Ur: NEGATIVE

## 2018-10-07 MED ORDER — DOXYCYCLINE HYCLATE 100 MG PO CAPS: 100.0000 mg | ORAL_CAPSULE | Freq: Two times a day (BID) | ORAL | 0 refills | Status: DC

## 2018-10-07 MED ORDER — LIDOCAINE HCL (PF) 1 % IJ SOLN
INTRAMUSCULAR | Status: AC
  Filled 2018-10-07: qty 5

## 2018-10-07 MED ORDER — MORPHINE SULFATE (PF) 4 MG/ML IV SOLN
4.0000 mg | Freq: Once | INTRAVENOUS | Status: AC
  Administered 2018-10-07: 12:00:00 4 mg via INTRAVENOUS
  Filled 2018-10-07: qty 1

## 2018-10-07 MED ORDER — ONDANSETRON HCL 4 MG/2ML IJ SOLN
4.0000 mg | Freq: Once | INTRAMUSCULAR | Status: AC
  Administered 2018-10-07: 11:00:00 4 mg via INTRAVENOUS
  Filled 2018-10-07: qty 2

## 2018-10-07 MED ORDER — METRONIDAZOLE 500 MG PO TABS: 500.0000 mg | ORAL_TABLET | Freq: Two times a day (BID) | ORAL | 0 refills | Status: DC

## 2018-10-07 MED ORDER — MORPHINE SULFATE (PF) 4 MG/ML IV SOLN
4.0000 mg | Freq: Once | INTRAVENOUS | Status: AC
  Administered 2018-10-07: 11:00:00 4 mg via INTRAVENOUS
  Filled 2018-10-07: qty 1

## 2018-10-07 MED ORDER — NAPROXEN 500 MG PO TABS: 500.0000 mg | ORAL_TABLET | Freq: Two times a day (BID) | ORAL | 0 refills | Status: DC

## 2018-10-07 MED ORDER — CEFTRIAXONE SODIUM 250 MG IJ SOLR
250.0000 mg | Freq: Once | INTRAMUSCULAR | Status: AC
  Administered 2018-10-07: 13:00:00 250 mg via INTRAMUSCULAR
  Filled 2018-10-07: qty 250

## 2018-10-07 MED ORDER — KETOROLAC TROMETHAMINE 15 MG/ML IJ SOLN
15.0000 mg | Freq: Once | INTRAMUSCULAR | Status: AC
  Administered 2018-10-07: 13:00:00 15 mg via INTRAVENOUS
  Filled 2018-10-07: qty 1

## 2018-10-07 NOTE — Discharge Instructions (Signed)
You were seen in the emergency department today for abdominal pain.  Your ultrasound did not show any acute abnormalities.  Your labs were all fairly normal.  Your wet prep did show findings consistent with trichomonas.  Trichomonas is an STD.  We also tested you for gonorrhea, chlamydia, HIV, and syphilis.  We will call you with any these test returned positive.  He will need to inform all sexual partners of positive test result so they may seek evaluation and possible treatment.  We are treating you for possible pelvic inflammatory disease given your pelvic pain with trichomonas.  Plan for pelvic inflammatory disease includes antibiotics: Doxycycline and Flagyl.  Take these as prescribed.  Do not drink alcohol when taking Flagyl as it can be extremely dangerous.  We are also send you home with a prescription for naproxen to help with pain.  - Naproxen is a nonsteroidal anti-inflammatory medication that will help with pain and swelling. Be sure to take this medication as prescribed with food, 1 pill every 12 hours,  It should be taken with food, as it can cause stomach upset, and more seriously, stomach bleeding. Do not take other nonsteroidal anti-inflammatory medications with this such as Advil, Motrin, Aleve, Mobic, Goodie Powder, or Motrin.    You make take Tylenol per over the counter dosing with these medications.   We have prescribed you new medication(s) today. Discuss the medications prescribed today with your pharmacist as they can have adverse effects and interactions with your other medicines including over the counter and prescribed medications. Seek medical evaluation if you start to experience new or abnormal symptoms after taking one of these medicines, seek care immediately if you start to experience difficulty breathing, feeling of your throat closing, facial swelling, or rash as these could be indications of a more serious allergic reaction  Follow-up with women's within the next 3  to 5 days.  Return to the ER for new or worsening symptoms including but not limited to worsening pain, fever, chills, inability to keep fluids down, or any other concerns.   Also follow-up with primary care for a recheck of your blood pressure within the next 1 to 2 weeks as it was elevated in the ER. Vitals:   10/07/18 1042 10/07/18 1134  BP: 137/79 (!) 155/100  Pulse: 70 67  Resp: 16 20  Temp:  98.2 F (36.8 C)  SpO2:  100%

## 2018-10-07 NOTE — ED Provider Notes (Signed)
[facility] EMERGENCY DEPARTMENT Provider Note   CSN: [id number] Arrival date & time: 10/07/18  0940    History   Chief Complaint Chief Complaint  Patient presents with   Abdominal Pain    HPI [name] is a 38 y.o. female with a hx of tobacco abuse, prior ectopic pregnancy, appendectomy, C-section, s/p tubal ligation who presents to the ED with complaints of pain to the R pelvic area that woke her from sleep at 0200 this AM. Reports pain waxes/wanes, it is sharp in nature, it is currently a 10/10 without alleviating/aggravating factors. Tried peptobismol w/o relief. Had 1 episode of non bloody loose stool around 0300. Reports urinary urgency. Has had some tan watery vaginal discharge. Sexually active w/ 1 partner in monogamous relationship, no protection, denies concern for STD. Has had some pain with intercourse recently. Denies fever, chills, nausea, vomiting, melena, hematochezia, vaginal bleeding, or dysuria. LMP 2 weeks prior, periods are irregular.      HPI  No past medical history on file.  Patient Active Problem List   Diagnosis Date Noted   Left tubal pregnancy without intrauterine pregnancy    Ectopic pregnancy without intrauterine pregnancy    Acute appendicitis 12/06/2011    Past Surgical History:  Procedure Laterality Date   CESAREAN SECTION     DIAGNOSTIC LAPAROSCOPY WITH REMOVAL OF ECTOPIC PREGNANCY N/A 05/18/2016   Procedure: DIAGNOSTIC LAPAROSCOPY WITH LEFT SALPINGECTOMY;  Surgeon: Catalina Antigua, MD;  Location: [facility];  Service: Gynecology;  Laterality: N/A;   DILATION AND CURETTAGE OF UTERUS     LAPAROSCOPIC APPENDECTOMY  12/05/2011   Procedure: APPENDECTOMY LAPAROSCOPIC;  Surgeon: Ardeth Sportsman, MD;  Location: [facility];  Service: General;  Laterality: N/A;   TUBAL LIGATION       OB History   No obstetric history on file.      Home Medications    Prior to Admission medications   Medication Sig Start Date End  Date Taking? Authorizing Provider  docusate sodium (COLACE) 100 MG capsule Take 1 capsule (100 mg total) by mouth 2 (two) times daily as needed. 05/18/16   Constant, Peggy, MD  ibuprofen (ADVIL,MOTRIN) 600 MG tablet Take 1 tablet (600 mg total) by mouth every 6 (six) hours as needed. 05/18/16   Constant, Peggy, MD  oxyCODONE-acetaminophen (PERCOCET/ROXICET) 5-325 MG tablet Take 1 tablet by mouth every 4 (four) hours as needed. 05/22/16   Hermina Staggers, MD  psyllium (HYDROCIL/METAMUCIL) 95 % PACK Take 1 packet by mouth 2 (two) times daily as needed. 12/06/11   Sherrie George, PA-C    Family History No family history on file.  Social History Social History   Tobacco Use   Smoking status: Current Every Day Smoker    Packs/day: 0.50    Years: 8.00    Pack years: 4.00   Smokeless tobacco: Never Used  Substance Use Topics   Alcohol use: Yes    Comment: 6 pack of beer per week   Drug use: No     Allergies   Patient has no known allergies.   Review of Systems Review of Systems  Constitutional: Negative for chills and fever.  Respiratory: Negative for shortness of breath.   Cardiovascular: Negative for chest pain.  Gastrointestinal: Positive for diarrhea. Negative for anal bleeding, blood in stool, constipation, nausea and vomiting.  Genitourinary: Positive for dyspareunia, pelvic pain, urgency and vaginal discharge. Negative for dysuria and vaginal bleeding.  All other systems reviewed and are negative.    Physical  Exam Updated Vital Signs BP (!) 155/100 (BP Location: Right Arm)    Pulse 67    Temp 98.2 F (36.8 C) (Oral)    Resp 20    Ht 5\' 4"  (1.626 m)    Wt 91.6 kg    LMP 09/23/2018    SpO2 100%    BMI 34.66 kg/m   Physical Exam Vitals signs and nursing note reviewed. Exam conducted with a chaperone present.  Constitutional:      General: She is not in acute distress.    Appearance: She is well-developed. She is not toxic-appearing.  HENT:     Head:  Normocephalic and atraumatic.  Eyes:     General:        Right eye: No discharge.        Left eye: No discharge.     Conjunctiva/sclera: Conjunctivae normal.  Neck:     Musculoskeletal: Neck supple.  Cardiovascular:     Rate and Rhythm: Normal rate and regular rhythm.  Pulmonary:     Effort: Pulmonary effort is normal. No respiratory distress.     Breath sounds: Normal breath sounds. No wheezing, rhonchi or rales.  Abdominal:     General: There is no distension.     Palpations: Abdomen is soft.     Tenderness: There is abdominal tenderness (R suprapubic area) in the suprapubic area. There is no right CVA tenderness, left CVA tenderness, guarding or rebound.  Genitourinary:    Exam position: Supine.     Labia:        Right: No lesion.        Left: No lesion.      Vagina: Vaginal discharge (minimal thin white) present.     Cervix: No cervical motion tenderness or friability.     Adnexa:        Right: Tenderness present. No mass or fullness.         Left: No mass, tenderness or fullness.       Comments: RN Britta MccreedyMichele Coffey present as chaperone. Inferior aspect of cervical os w/ slightly darker area compared to other portions of the cervix.   Skin:    General: Skin is warm and dry.     Findings: No rash.  Neurological:     Mental Status: She is alert.     Comments: Clear speech.   Psychiatric:        Behavior: Behavior normal.    ED Treatments / Results  Labs (all labs ordered are listed, but only abnormal results are displayed) Labs Reviewed  WET PREP, GENITAL - Abnormal; Notable for the following components:      Result Value   Trich, Wet Prep PRESENT (*)    WBC, Wet Prep HPF POC FEW (*)    All other components within normal limits  COMPREHENSIVE METABOLIC PANEL - Abnormal; Notable for the following components:   Glucose, Bld 101 (*)    All other components within normal limits  URINALYSIS, ROUTINE W REFLEX MICROSCOPIC  PREGNANCY, URINE  CBC WITH DIFFERENTIAL/PLATELET   CBC WITH DIFFERENTIAL/PLATELET  RPR  HIV ANTIBODY (ROUTINE TESTING W REFLEX)  GC/CHLAM[facility]DIA PROBE AMP [facility]    EKG None  Radiology [place] Transvaginal Non-ob  Result Date: 10/07/2018 CLINICAL DATA:  Right lower quadrant pain that started last night. EXAM: TRANSABDOMINAL AND TRANSVAGINAL ULTRASOUND OF PELVIS DOPPLER ULTRASOUND OF OVARIES TECHNIQUE: Both transabdominal and transvaginal ultrasound examinations of the pelvis were performed. Transabdominal technique was performed for global imaging of the pelvis  including uterus, ovaries, adnexal regions, and pelvic cul-de-sac. It was necessary to proceed with endovaginal exam following the transabdominal exam to visualize the ovaries. Color and duplex Doppler ultrasound was utilized to evaluate blood flow to the ovaries. COMPARISON:  05/18/2016. FINDINGS: Uterus Measurements: 8.8 x 4.3 x 4.8 cm = volume: 94.9 mL. No fibroids or other mass visualized. Endometrium Thickness: 8.9 mm.  No focal abnormality visualized. Right ovary Measurements: 3.4 x 2.7 x 2.3 cm = volume: 11 mL. Normal appearance/no adnexal mass. Left ovary Measurements: 2.9 by 2.2 x 2.5 cm = volume: 8.0 mL. Normal appearance/no adnexal mass. Pulsed Doppler evaluation of both ovaries demonstrates normal low-resistance arterial and venous waveforms. Other findings Trace free fluid. IMPRESSION: 1. No acute findings within the pelvis. No evidence for ovarian torsion or other explanation for patient's right lower quadrant pain. Electronically Signed   By: Signa Kell M.D.   On: 10/07/2018 12:02   US Pelvis Complete  Result Date: 10/07/2018 CLINICAL DATA:  Right lower quadrant pain that started last night. EXAM: TRANSABDOMINAL AND TRANSVAGINAL ULTRASOUND OF PELVIS DOPPLER ULTRASOUND OF OVARIES TECHNIQUE: Both transabdominal and transvaginal ultrasound examinations of the pelvis were performed. Transabdominal technique was performed for global imaging of the pelvis including  uterus, ovaries, adnexal regions, and pelvic cul-de-sac. It was necessary to proceed with endovaginal exam following the transabdominal exam to visualize the ovaries. Color and duplex Doppler ultrasound was utilized to evaluate blood flow to the ovaries. COMPARISON:  05/18/2016. FINDINGS: Uterus Measurements: 8.8 x 4.3 x 4.8 cm = volume: 94.9 mL. No fibroids or other mass visualized. Endometrium Thickness: 8.9 mm.  No focal abnormality visualized. Right ovary Measurements: 3.4 x 2.7 x 2.3 cm = volume: 11 mL. Normal appearance/no adnexal mass. Left ovary Measurements: 2.9 by 2.2 x 2.5 cm = volume: 8.0 mL. Normal appearance/no adnexal mass. Pulsed Doppler evaluation of both ovaries demonstrates normal low-resistance arterial and venous waveforms. Other findings Trace free fluid. IMPRESSION: 1. No acute findings within the pelvis. No evidence for ovarian torsion or other explanation for patient's right lower quadrant pain. Electronically Signed   By: Signa Kell M.D.   On: 10/07/2018 12:02   [place] Art/ven Flow Abd Pelv Doppler  Result Date: 10/07/2018 CLINICAL DATA:  Right lower quadrant pain that started last night. EXAM: TRANSABDOMINAL AND TRANSVAGINAL ULTRASOUND OF PELVIS DOPPLER ULTRASOUND OF OVARIES TECHNIQUE: Both transabdominal and transvaginal ultrasound examinations of the pelvis were performed. Transabdominal technique was performed for global imaging of the pelvis including uterus, ovaries, adnexal regions, and pelvic cul-de-sac. It was necessary to proceed with endovaginal exam following the transabdominal exam to visualize the ovaries. Color and duplex Doppler ultrasound was utilized to evaluate blood flow to the ovaries. COMPARISON:  05/18/2016. FINDINGS: Uterus Measurements: 8.8 x 4.3 x 4.8 cm = volume: 94.9 mL. No fibroids or other mass visualized. Endometrium Thickness: 8.9 mm.  No focal abnormality visualized. Right ovary Measurements: 3.4 x 2.7 x 2.3 cm = volume: 11 mL. Normal appearance/no  adnexal mass. Left ovary Measurements: 2.9 by 2.2 x 2.5 cm = volume: 8.0 mL. Normal appearance/no adnexal mass. Pulsed Doppler evaluation of both ovaries demonstrates normal low-resistance arterial and venous waveforms. Other findings Trace free fluid. IMPRESSION: 1. No acute findings within the pelvis. No evidence for ovarian torsion or other explanation for patient's right lower quadrant pain. Electronically Signed   By: Signa Kell M.D.   On: 10/07/2018 12:02    Procedures Procedures (including critical care time)  Medications Ordered in ED Medications  cefTRIAXone (ROCEPHIN)  injection 250 mg (has no administration in time range)  ketorolac (TORADOL) 15 MG/ML injection 15 mg (has no administration in time range)  morphine 4 MG/ML injection 4 mg (4 mg Intravenous Given 10/07/18 1031)  ondansetron (ZOFRAN) injection 4 mg (4 mg Intravenous Given 10/07/18 1031)  morphine 4 MG/ML injection 4 mg (4 mg Intravenous Given 10/07/18 1156)     Initial Impression / Assessment and Plan / ED Course  I have reviewed the triage vital signs and the nursing notes.  Pertinent labs & imaging results that were available during my care of the patient were reviewed by me and considered in my medical decision making (see chart for details).   Patient presents to the ED w/ R sided pelvic pain. Nontoxic appearing, no apparent distress, vitals WNL other than elevated BP- doubt HTN emergency, PCP recheck. On exam she has R sided suprapubic tenderness & R adnexal tenderness w/ some mild white thin vaginal discharge noted as well as some discoloration at the inferior aspect of the cervical os. DDX: Ovarian cyst/mass, ovarian torsion, UTI, STI/ PID, TOA, ectopic pregnancy, also considering nephrolithiasis or colitis but the two seem less likely. S/p appendectomy. Plan for labs & pelvic US.   Work-up reviewed:  CBC: No leukocytosis or anemia. CMP: No electrolyte derangement. LFTs & renal function WNL.  UA: No UTI Preg  test: Negative- doubt ectopic.  Wet prep: + for trich.  GC/chlamydia/HIV/RPR- pending.  [place]: No acute findings within the pelvis. No evidence for ovarian torsion or other explanation for patient's right lower quadrant pain. -- No signs of torsion, TOA, cyst, or mass.   With wet prep + for trich, discharge, dyspareunia, & R adnexal tenderness will tx for PID, not septic appearing, no leukocytosis or fever, does not appear to require hospitalization @ this time. Rocephin in the ER. Doxy/flagyl w/ naproxen for pain control. Discussed no EtOh w/ flagyl. Patient aware of need to inform all sexual partners of trich, also aware of pending additional STD tests above. Women's follow up. I discussed results, treatment plan, need for follow-up, and return precautions with the patient. Provided opportunity for questions, patient confirmed understanding and is in agreement with plan.    Final Clinical Impressions(s) / ED Diagnoses   Final diagnoses:  Trichomonal vaginitis  PID (acute pelvic inflammatory disease)    ED Discharge Orders         Ordered    doxycycline (VIBRAMYCIN) 100 MG capsule  2 times daily     10/07/18 1305    metroNIDAZOLE (FLAGYL) 500 MG tablet  2 times daily     10/07/18 1305    naproxen (NAPROSYN) 500 MG tablet  2 times daily     10/07/18 1305           Shamarcus Hoheisel, Pleas Koch, PA-C 10/07/18 1310    Benjiman Core, MD 10/07/18 1506

## 2018-10-07 NOTE — ED Triage Notes (Signed)
Woke from sleep with pain to Right lower quadrant pain  No N/V x 2 episodes of diarrhea.  No vaginal bleeding, does report discharge, tan colored.  Pain similar to ectopic and when she had her appy.

## 2018-10-07 NOTE — Telephone Encounter (Signed)
Pt. Reports she started having lower right-sided abdominal pain at 0200 this morning. Has been constant. Rates pain 10/10. Denies fever. States she did have some diarrhea last night. Instructed to go to ED for evaluation.  Reason for Disposition . Patient sounds very sick or weak to the triager  Answer Assessment - Initial Assessment Questions 1. LOCATION: "Where does it hurt?"      Right low toward groin 2. RADIATION: "Does the pain shoot anywhere else?" (e.g., chest, back)     No 3. ONSET: "When did the pain begin?" (e.g., minutes, hours or days ago)      0200 this morning 4. SUDDEN: "Gradual or sudden onset?"     Sudden 5. PATTERN "Does the pain come and go, or is it constant?"    - If constant: "Is it getting better, staying the same, or worsening?"      (Note: Constant means the pain never goes away completely; most serious pain is constant and it progresses)     - If intermittent: "How long does it last?" "Do you have pain now?"     (Note: Intermittent means the pain goes away completely between bouts)     Constant 6. SEVERITY: "How bad is the pain?"  (e.g., Scale 1-10; mild, moderate, or severe)   - MILD (1-3): doesn't interfere with normal activities, abdomen soft and not tender to touch    - MODERATE (4-7): interferes with normal activities or awakens from sleep, tender to touch    - SEVERE (8-10): excruciating pain, doubled over, unable to do any normal activities      10 7. RECURRENT SYMPTOM: "Have you ever had this type of abdominal pain before?" If so, ask: "When was the last time?" and "What happened that time?"      Yes 8. CAUSE: "What do you think is causing the abdominal pain?"     Unsure 9. RELIEVING/AGGRAVATING FACTORS: "What makes it better or worse?" (e.g., movement, antacids, bowel movement)     No 10. OTHER SYMPTOMS: "Has there been any vomiting, diarrhea, constipation, or urine problems?"       No 11. PREGNANCY: "Is there any chance you are pregnant?" "When  was your last menstrual period?"       No  Protocols used: ABDOMINAL PAIN - [facility]

## 2018-10-07 NOTE — ED Triage Notes (Signed)
Pt in with c/o sharp RLQ pain since 0200 this am. Denies any n/v, but has been having diarrhea since last night. No longer has appendix

## 2018-10-08 LAB — HIV ANTIBODY (ROUTINE TESTING W REFLEX): HIV Screen 4th Generation wRfx: NONREACTIVE

## 2018-10-08 LAB — GC/CHLAMYDIA PROBE AMP (~~LOC~~) NOT AT ARMC
Chlamydia: NEGATIVE
Neisseria Gonorrhea: NEGATIVE

## 2018-10-08 LAB — RPR: RPR Ser Ql: NONREACTIVE

## 2019-01-13 ENCOUNTER — Other Ambulatory Visit: Payer: Self-pay

## 2019-01-13 ENCOUNTER — Ambulatory Visit (INDEPENDENT_AMBULATORY_CARE_PROVIDER_SITE_OTHER): Payer: 59 | Admitting: Obstetrics and Gynecology

## 2019-01-13 ENCOUNTER — Other Ambulatory Visit (HOSPITAL_COMMUNITY)
Admission: RE | Admit: 2019-01-13 | Discharge: 2019-01-13 | Disposition: A | Discharge status: Home / Self Care | Payer: 59 | Source: Ambulatory Visit | Attending: Obstetrics and Gynecology | Admitting: Obstetrics and Gynecology

## 2019-01-13 ENCOUNTER — Encounter: Payer: Self-pay | Admitting: Obstetrics and Gynecology

## 2019-01-13 VITALS — BP 154/97 | HR 81 | Temp 97.5°F | Ht 64.0 in | Wt 212.3 lb

## 2019-01-13 DIAGNOSIS — Z01419 Encounter for gynecological examination (general) (routine) without abnormal findings: Secondary | ICD-10-CM | POA: Diagnosis not present

## 2019-01-13 DIAGNOSIS — R1084 Generalized abdominal pain: Secondary | ICD-10-CM

## 2019-01-13 DIAGNOSIS — Z124 Encounter for screening for malignant neoplasm of cervix: Secondary | ICD-10-CM

## 2019-01-13 DIAGNOSIS — R3915 Urgency of urination: Secondary | ICD-10-CM

## 2019-01-13 DIAGNOSIS — Z3189 Encounter for other procreative management: Secondary | ICD-10-CM

## 2019-01-13 DIAGNOSIS — Z113 Encounter for screening for infections with a predominantly sexual mode of transmission: Secondary | ICD-10-CM | POA: Insufficient documentation

## 2019-01-13 DIAGNOSIS — R030 Elevated blood-pressure reading, without diagnosis of hypertension: Secondary | ICD-10-CM

## 2019-01-13 NOTE — Patient Instructions (Signed)
In Vitro Fertilization  In vitro fertilization (IVF) is a series of procedures that are used to help with getting pregnant (conceiving). It can be used to help treat problems with fertility or genetics. IVF is a type of assisted reproductive technology (ART). ART refers to all treatments and procedures that combine eggs and sperm outside of the body to try to help a couple conceive. During IVF, eggs are retrieved from the ovaries and combined with sperm in a lab to fertilize the eggs. One or more of the fertilized eggs (embryos) are inserted into the uterus through the cervix. Candidates for IVF include:  People who are infertile. Infertility is when you are unable to conceive after a year of having sex regularly without using birth control. Infertility can also mean that a woman is not able to carry a pregnancy to full term.  Women who have undergone early (premature) menopause or ovarian failure.  Women who had both ovaries removed. In this case, donor eggs must be used.  Women who have damaged or blocked fallopian tubes. There is no age limit for having IVF, but it is not recommended for women who have gone through menopause (postmenopausal women). The ideal age for IVF is age 40 or younger. Women age 40 or older are often counseled to consider using donor eggs during IVF to increase the chances of success. Tell a health care provider about:  Any allergies you have.  All medicines you are taking, including vitamins, herbs, eye drops, creams, and over-the-counter medicines.  Any problems you or family members have had with anesthetic medicines.  Any blood disorders you have.  Any surgeries you have had.  Any medical conditions you have.  Previous pregnancies you have had.  Any history of drug use, smoking, or excessive alcohol use. What are the risks? Generally, this is a safe procedure. However, problems may occur, including:  Infection.  Bleeding.  Allergic reactions to  medicines.  Damage to other structures or organs.  Blood clots.  The procedure not working.  Having twins or multiples.  Increased risk of early delivery. What happens before the procedure? Before beginning a cycle of IVF, you and the sperm donor may need various tests (screenings) to make sure that IVF is right for you. You and the donor:  Must provide a complete medical history and the medical history of your families.  Will have a physical exam.  May need blood tests to check for infectious diseases, including HIV (human immunodeficiency virus). You may have other tests, such as:  Testing of your ovaries to determine the quality and quantity of your eggs.  Hormone tests and ovulation testing.  An exam of your uterus. This may be done with: ? Sonohysterogram. This exam uses sound waves sent to a computer to make real-time images of the inside of your uterus. To get the best images, a germ-free salt-water solution (sterile saline) is injected into your uterus through your vagina. ? Hysteroscopy. In this procedure, a thin, flexible tube with a tiny light and camera on the end of it (hysteroscope) is inserted through your vagina and into your uterus. The donor sperm will be taken and analyzed to check whether:  The sperm are normal.  There are enough sperm to fertilize the egg.  The sperm act normally after sexual intercourse (postcoital exam). What happens during the procedure? IVF involves several procedures. One cycle of IVF can take about 2 weeks, and more than one cycle may be required. The steps of IVF  are:  Ovarian stimulation. ? If you use your own eggs during IVF, you will begin treatment with artificial (synthetic) hormones at the start of a cycle. This treatment stimulates your ovaries to produce multiple eggs, rather than the single egg that normally develops each month. ? Multiple eggs are needed because some eggs will not fertilize or will not develop normally  after fertilization.  Egg retrieval. ? Using ultrasound images as a guide, a thin needle will be inserted through your vagina and into the ovary and sacs (follicles) that contain the eggs. ? The needle is connected to a suction device, which will pull the eggs and fluid out of each follicle, one at a time. ? The procedure will be repeated for the other ovary.  Insemination and fertilization. ? The sperm will be mixed together with your eggs (insemination) and stored in an environmentally controlled chamber. ? The sperm usually enters (fertilizes) an egg a few hours after insemination.  Embryo transfer. This usually takes place 2-6 days after the egg retrieval. ? The fertilized eggs (embryos) will be placed into your uterus using a thin tube (catheter). The catheter will be inserted into your vagina, through your cervix, and into your uterus. ? If successful, the embryo will stick to the lining of your uterus (implant) about 6-10 days after egg retrieval. If an embryo implants in the lining of the uterus and grows, pregnancy will result. These procedures may vary among health care providers and hospitals. What happens after the procedure?  You may need to lie down and rest for a short time.  You may continue to take hormone therapy. You may take hormone therapy for up to 3 months, as instructed by your health care provider. Summary  In vitro fertilization (IVF) is a series of procedures that are used to help with getting pregnant (conceiving).  Before beginning a cycle of IVF, you and the sperm donor may need various tests (screenings) to make sure that IVF is right for you.  IVF involves several procedures. One cycle of IVF can take about 2 weeks, and more than one cycle may be required. This information is not intended to replace advice given to you by your health care provider. Make sure you discuss any questions you have with your health care provider. Document Released: 04/25/2008  Document Revised: 04/25/2017 Document Reviewed: 02/12/2016 Elsevier Patient Education  2020 [employer].

## 2019-01-13 NOTE — Progress Notes (Signed)
GYNECOLOGY ANNUAL PREVENTATIVE CARE ENCOUNTER NOTE  Subjective:   [name] is a 38 y.o. [id number]. female here for a annual gynecologic exam. Current complaints: abdominal pain. Reports she has been to the ED and they told her she has an infection of some sort. They gave her medicines, which she took and it has not improved. Reports burning in lower abdomen, "swithces back and forth on both sides," comes and goes. Happens 3-4 days per week, sometimes lasting all day, sometimes it is much shorter. Occasionally feels like she needs to have a bowel movement. Reports having a bowel movement relieves her abdominal pain. Occasionally has light-headedness, denies fever, chills, nausea, vomiting. Sometimes diarrhea, sometimes constipation, sometimes she has regular bowel movements. No bloody stool.  Treated for pelvic inflammatory disease by ED in 09/2018. States she took all of her medications. States the pain she is currently feeling is different than the pain she felt when she went to the ED, this pain is not as bad.   Also has urinary urgency and some urge incontinence. Urgency to go occurs daily.  Period is random, lasts 7 days. Heavy the whole 7 days. Also with dysmenorrhea, states she feels like "it is so bad, she could be going into labor." Had period in June, possibly in May. Did not have period 2 months before that. Has always been very irregular.   Pain with intercourse, also with white discharge.  Desires STI screen.  Gynecologic History Patient's last menstrual period was 11/09/2018 (approximate). Contraception: tubal ligation Last Pap: ? 2013, denies history of abnormal pap Last mammogram: none  Obstetric History OB History  Gravida Para Term Preterm AB Living  4 2 1   2     SAB TAB Ectopic Multiple Live Births  1   1        # Outcome Date GA Lbr Len/2nd Weight Sex Delivery Anes PTL Lv  4 Ectopic           3 Para      CS-Unspec     2 Term      CS-Unspec     1 SAB              History reviewed. No pertinent past medical history.  Past Surgical History:  Procedure Laterality Date  . CESAREAN SECTION    . DIAGNOSTIC LAPAROSCOPY WITH REMOVAL OF ECTOPIC PREGNANCY N/A 05/18/2016   Procedure: DIAGNOSTIC LAPAROSCOPY WITH LEFT SALPINGECTOMY;  Surgeon: Catalina AntiguaPeggy Constant, MD;  Location: [facility];  Service: Gynecology;  Laterality: N/A;  . DILATION AND CURETTAGE OF UTERUS    . LAPAROSCOPIC APPENDECTOMY  12/05/2011   Procedure: APPENDECTOMY LAPAROSCOPIC;  Surgeon: Ardeth SportsmanSteven C. Gross, MD;  Location: [facility];  Service: General;  Laterality: N/A;  . TUBAL LIGATION      Current Outpatient Medications on File Prior to Visit  Medication Sig Dispense Refill  . docusate sodium (COLACE) 100 MG capsule Take 1 capsule (100 mg total) by mouth 2 (two) times daily as needed. (Patient not taking: Reported on 01/13/2019) 30 capsule 2  . doxycycline (VIBRAMYCIN) 100 MG capsule Take 1 capsule (100 mg total) by mouth 2 (two) times daily. (Patient not taking: Reported on 01/13/2019) 28 capsule 0  . ibuprofen (ADVIL,MOTRIN) 600 MG tablet Take 1 tablet (600 mg total) by mouth every 6 (six) hours as needed. (Patient not taking: Reported on 01/13/2019) 30 tablet 1  . metroNIDAZOLE (FLAGYL) 500 MG tablet Take 1 tablet (500 mg total) by mouth 2 (two) times daily. (  Patient not taking: Reported on 01/13/2019) 28 tablet 0  . naproxen (NAPROSYN) 500 MG tablet Take 1 tablet (500 mg total) by mouth 2 (two) times daily. (Patient not taking: Reported on 01/13/2019) 10 tablet 0  . oxyCODONE-acetaminophen (PERCOCET/ROXICET) 5-325 MG tablet Take 1 tablet by mouth every 4 (four) hours as needed. (Patient not taking: Reported on 01/13/2019) 20 tablet 0  . psyllium (HYDROCIL/METAMUCIL) 95 % PACK Take 1 packet by mouth 2 (two) times daily as needed. (Patient not taking: Reported on 01/13/2019) 56 each    No current facility-administered medications on file prior to visit.     No Known Allergies  Social History    Socioeconomic History  . Marital status: Single    Spouse name: Not on file  . Number of children: Not on file  . Years of education: Not on file  . Highest education level: Not on file  Occupational History  . Not on file  Social Needs  . Financial resource strain: Not on file  . Food insecurity    Worry: Not on file    Inability: Not on file  . Transportation needs    Medical: Not on file    Non-medical: Not on file  Tobacco Use  . Smoking status: Current Every Day Smoker    Packs/day: 0.50    Years: 8.00    Pack years: 4.00  . Smokeless tobacco: Never Used  Substance and Sexual Activity  . Alcohol use: Yes    Comment: 6 pack of beer per week  . Drug use: No  . Sexual activity: Yes    Partners: Male    Birth control/protection: None  Lifestyle  . Physical activity    Days per week: Not on file    Minutes per session: Not on file  . Stress: Not on file  Relationships  . Social Musicianconnections    Talks on phone: Not on file    Gets together: Not on file    Attends religious service: Not on file    Active member of club or organization: Not on file    Attends meetings of clubs or organizations: Not on file    Relationship status: Not on file  . Intimate partner violence    Fear of current or ex partner: Not on file    Emotionally abused: Not on file    Physically abused: Not on file    Forced sexual activity: Not on file  Other Topics Concern  . Not on file  Social History Narrative  . Not on file    History reviewed. No pertinent family history.  The following portions of the patient's history were reviewed and updated as appropriate: allergies, current medications, past family history, past medical history, past social history, past surgical history and problem list.  Review of Systems Pertinent items are noted in HPI.   Objective:  BP (!) 154/97 (BP Location: Left Arm)   Pulse 81   Temp (!) 97.5 F (36.4 C)   Ht 5\' 4"  (1.626 m)   Wt 212 lb 4.8 oz (96.3  kg)   LMP 11/09/2018 (Approximate)   BMI 36.44 kg/m  CONSTITUTIONAL: Well-developed, well-nourished female in no acute distress.  HENT:  Normocephalic, atraumatic, External right and left ear normal. Oropharynx is clear and moist EYES: Conjunctivae and EOM are normal. Pupils are equal, round, and reactive to light. No scleral icterus.  NECK: Normal range of motion, supple, no masses.  Normal thyroid.  SKIN: Skin is warm and dry. No  rash noted. Not diaphoretic. No erythema. No pallor. NEUROLOGIC: Alert and oriented to person, place, and time. Normal reflexes, muscle tone coordination. No cranial nerve deficit noted. PSYCHIATRIC: Normal mood and affect. Normal behavior. Normal judgment and thought content. CARDIOVASCULAR: Normal heart rate noted, RESPIRATORY: Effort normal, no problems with respiration noted. BREASTS: Symmetric in size. No masses, skin changes, nipple drainage, or lymphadenopathy. ABDOMEN: Soft, normal bowel sounds, no distention noted.  Very mild tenderness in LLQ, no rebound or guarding.  PELVIC: Normal appearing external genitalia; normal appearing vaginal mucosa and cervix.  No abnormal discharge noted.  Pap smear obtained.  Normal uterine size, no other palpable masses, no uterine or adnexal tenderness. MUSCULOSKELETAL: Normal range of motion. No tenderness.  No cyanosis, clubbing, or edema. 2+ distal pulses.  Exam done with chaperone present.  Assessment and Plan:   1. Well woman exam - MM Digital Screening; Future  2. Routine screening for STI (sexually transmitted infection) - Cervicovaginal ancillary only( [place]) - [facility] - HepB+HepC+HIV Panel  3. Cervical cancer screening - Cytology - PAP( [place])  4. Elevated blood pressure reading No history of hypertension, one elevated reading - will refer to PCP - [facility]ulatory referral to [facility]  5. Encounter for fertility planning - Patient interested in future fertility, she has had tubal ligation  in the past - reviewed that she will need to have IVF in order to become pregnant, encouraged her to do some reading regarding IVF and if she is interested in pursuing, will refer to [facility]  6. Urinary urgency Reviewed decreasing intake of bladder irritants, cut out caffeine, alcohol and spicy foods, see if this improves her symptoms - Urine Culture - if no improvement, would refer to Urology/Urogyn   7. Generalized abdominal pain Reviewed that abdominal pain sounds more GI in nature, rather than pelvic, given that her pain moves around her abdomen, is relieved by bowel movements and she has alternately constipation, diarrhea and normal BM. Advised she may need to be referred to GI, she verbalizes understanding and is in agreement with plan  Will follow up results of pap smear/STI screen and manage accordingly. Encouraged improvement in diet and exercise.  Mammogram ordered today   Routine preventative health maintenance measures emphasized. Please refer to After Visit Summary for other counseling recommendations.   Total face-to-face time with patient: 40 minutes. Over 50% of encounter was spent on counseling and coordination of care.   Feliz Beam, M.D. Attending Center for [employer])

## 2019-01-13 NOTE — Progress Notes (Addendum)
GYN presents for AEX/PAP/STD testing.  C/o lower abdominal pain 7/10 x month.  Denies odor, DC, NV, chills, fever. Patient had Tubal in 2007. She also c/o headaches 8/10, dizziness and aurora x 2 months Patient does not have a PCP and wants a Referral.

## 2019-01-14 ENCOUNTER — Encounter: Payer: Self-pay | Admitting: Obstetrics and Gynecology

## 2019-01-15 LAB — URINE CULTURE

## 2019-01-15 LAB — HEPB+HEPC+HIV PANEL
HIV Screen 4th Generation wRfx: NONREACTIVE
Hep B C IgM: NEGATIVE
Hep B Core Total Ab: NEGATIVE
Hep B E Ab: NEGATIVE
Hep B E Ag: NEGATIVE
Hep B Surface Ab, Qual: NONREACTIVE
Hep C Virus Ab: <0.1 s/co ratio (ref 0.0–0.9)
Hepatitis B Surface Ag: NEGATIVE

## 2019-01-15 LAB — CERVICOVAGINAL ANCILLARY ONLY
Bacterial vaginitis: POSITIVE — AB
Candida vaginitis: NEGATIVE
Chlamydia: NEGATIVE
Neisseria Gonorrhea: NEGATIVE
Trichomonas: NEGATIVE

## 2019-01-15 LAB — RPR: RPR Ser Ql: NONREACTIVE

## 2019-01-16 LAB — CYTOLOGY - PAP
Adequacy: ABSENT
Diagnosis: NEGATIVE
HPV: NOT DETECTED

## 2019-01-18 MED ORDER — NITROFURANTOIN MONOHYD MACRO 100 MG PO CAPS: 100.0000 mg | ORAL_CAPSULE | Freq: Two times a day (BID) | ORAL | 1 refills | Status: DC

## 2019-01-18 MED ORDER — METRONIDAZOLE 500 MG PO TABS: 500.0000 mg | ORAL_TABLET | Freq: Two times a day (BID) | ORAL | 0 refills | Status: DC

## 2019-01-18 NOTE — Addendum Note (Signed)
Addended by: Vivien Rota on: 01/18/2019 12:10 PM   Modules accepted: Orders

## 2019-02-17 ENCOUNTER — Ambulatory Visit (INDEPENDENT_AMBULATORY_CARE_PROVIDER_SITE_OTHER): Payer: 59 | Admitting: Family Medicine

## 2019-02-17 ENCOUNTER — Other Ambulatory Visit: Payer: Self-pay

## 2019-02-17 ENCOUNTER — Encounter: Payer: Self-pay | Admitting: Family Medicine

## 2019-02-17 VITALS — BP 142/80 | HR 82 | Ht 64.0 in | Wt 212.4 lb

## 2019-02-17 DIAGNOSIS — Z720 Tobacco use: Secondary | ICD-10-CM

## 2019-02-17 DIAGNOSIS — Z0000 Encounter for general adult medical examination without abnormal findings: Secondary | ICD-10-CM | POA: Insufficient documentation

## 2019-02-17 DIAGNOSIS — L299 Pruritus, unspecified: Secondary | ICD-10-CM

## 2019-02-17 DIAGNOSIS — I10 Essential (primary) hypertension: Secondary | ICD-10-CM

## 2019-02-17 DIAGNOSIS — R030 Elevated blood-pressure reading, without diagnosis of hypertension: Secondary | ICD-10-CM | POA: Diagnosis not present

## 2019-02-17 DIAGNOSIS — R1030 Lower abdominal pain, unspecified: Secondary | ICD-10-CM

## 2019-02-17 LAB — POCT URINALYSIS DIP (MANUAL ENTRY)
Bilirubin, UA: NEGATIVE
Glucose, UA: NEGATIVE mg/dL
Ketones, POC UA: NEGATIVE mg/dL
Leukocytes, UA: NEGATIVE
Nitrite, UA: NEGATIVE
Protein Ur, POC: NEGATIVE mg/dL
Spec Grav, UA: 1.02 (ref 1.010–1.025)
Urobilinogen, UA: 0.2 E.U./dL
pH, UA: 6 (ref 5.0–8.0)

## 2019-02-17 MED ORDER — VARENICLINE TARTRATE 0.5 MG PO TABS: 0.5000 mg | ORAL_TABLET | Freq: Every day | ORAL | 2 refills | Status: DC

## 2019-02-17 NOTE — Patient Instructions (Addendum)
It was a pleasure to see you today! Thank you for choosing [facility] for your primary care. [name] was seen for establishing care and concern for hypertension.   Our plans for today were:  Smoking Cessation: I have prescribed Chantix for you to begin to help with smoking cessation.   Please take 1 tablet for the first three days. On day 4 until day 7, take 1 tablet twice per day. On day 8, start taking two pills two times daily. You will plan to take these pills for a total of 3 months.   HTN: Your blood pressure is not high to be in a dangerous zone. We can try to decrease your numbers by decreasing salt in your diet and exercising. Please follow up with me in 1 month for a check. Please try to measure your blood pressure 2-3 times per week at home in order to get a better idea of your average measurements.   Itching: Please try the methods that we discussed today.  Abdominal Pains: Please continue to watch these pains. I will recommend that you discuss this again with your OB/GYN provider.   We will do some basic labwork to day in order to check your liver, kidney and electrolytes. I will call if there are any abnormal values.   To keep you healthy, we need to monitor some screening tests.   You are due for :  1. Influenza Vaccine - you plan to receive this at your job  You should return to our clinic to see me in 1 month.   Best,  Dr. Rosita Fire  Hypertension, Adult Hypertension is another name for high blood pressure. High blood pressure forces your heart to work harder to pump blood. This can cause problems over time. There are two numbers in a blood pressure reading. There is a top number (systolic) over a bottom number (diastolic). It is best to have a blood pressure that is below 120/80. Healthy choices can help lower your blood pressure, or you may need medicine to help lower it. What are the causes? The cause of this condition is not known. Some conditions  may be related to high blood pressure. What increases the risk?  Smoking.  Having type 2 diabetes mellitus, high cholesterol, or both.  Not getting enough exercise or physical activity.  Being overweight.  Having too much fat, sugar, calories, or salt (sodium) in your diet.  Drinking too much alcohol.  Having long-term (chronic) kidney disease.  Having a family history of high blood pressure.  Age. Risk increases with age.  Race. You may be at higher risk if you are African American.  Gender. Men are at higher risk than women before age 66. After age 64, women are at higher risk than men.  Having obstructive sleep apnea.  Stress. What are the signs or symptoms?  High blood pressure may not cause symptoms. Very high blood pressure (hypertensive crisis) may cause: ? Headache. ? Feelings of worry or nervousness (anxiety). ? Shortness of breath. ? Nosebleed. ? A feeling of being sick to your stomach (nausea). ? Throwing up (vomiting). ? Changes in how you see. ? Very bad chest pain. ? Seizures. How is this treated?  This condition is treated by making healthy lifestyle changes, such as: ? Eating healthy foods. ? Exercising more. ? Drinking less alcohol.  Your health care provider may prescribe medicine if lifestyle changes are not enough to get your blood pressure under control, and if: ?  Your top number is above 130. ? Your bottom number is above 80.  Your personal target blood pressure may vary. Follow these instructions at home: Eating and drinking   If told, follow the DASH eating plan. To follow this plan: ? Fill one half of your plate at each meal with fruits and vegetables. ? Fill one fourth of your plate at each meal with whole grains. Whole grains include whole-wheat pasta, brown rice, and whole-grain bread. ? Eat or drink low-fat dairy products, such as skim milk or low-fat yogurt. ? Fill one fourth of your plate at each meal with low-fat (lean)  proteins. Low-fat proteins include fish, chicken without skin, eggs, beans, and tofu. ? Avoid fatty meat, cured and processed meat, or chicken with skin. ? Avoid pre-made or processed food.  Eat less than 1,500 mg of salt each day.  Do not drink alcohol if: ? Your doctor tells you not to drink. ? You are pregnant, may be pregnant, or are planning to become pregnant.  If you drink alcohol: ? Limit how much you use to:  0-1 drink a day for women.  0-2 drinks a day for men. ? Be aware of how much alcohol is in your drink. In the [place] one drink equals one 12 oz bottle of beer (355 mL), one 5 oz glass of wine (148 mL), or one 1 oz glass of hard liquor (44 mL). Lifestyle   Work with your doctor to stay at a healthy weight or to lose weight. Ask your doctor what the best weight is for you.  Get at least 30 minutes of exercise most days of the week. This may include walking, swimming, or biking.  Get at least 30 minutes of exercise that strengthens your muscles (resistance exercise) at least 3 days a week. This may include lifting weights or doing Pilates.  Do not use any products that contain nicotine or tobacco, such as cigarettes, e-cigarettes, and chewing tobacco. If you need help quitting, ask your doctor.  Check your blood pressure at home as told by your doctor.  Keep all follow-up visits as told by your doctor. This is important. Medicines  Take over-the-counter and prescription medicines only as told by your doctor. Follow directions carefully.  Do not skip doses of blood pressure medicine. The medicine does not work as well if you skip doses. Skipping doses also puts you at risk for problems.  Ask your doctor about side effects or reactions to medicines that you should watch for. Contact a doctor if you:  Think you are having a reaction to the medicine you are taking.  Have headaches that keep coming back (recurring).  Feel dizzy.  Have swelling in your  ankles.  Have trouble with your vision. Get help right away if you:  Get a very bad headache.  Start to feel mixed up (confused).  Feel weak or numb.  Feel faint.  Have very bad pain in your: ? Chest. ? Belly (abdomen).  Throw up more than once.  Have trouble breathing. Summary  Hypertension is another name for high blood pressure.  High blood pressure forces your heart to work harder to pump blood.  For most people, a normal blood pressure is less than 120/80.  Making healthy choices can help lower blood pressure. If your blood pressure does not get lower with healthy choices, you may need to take medicine. This information is not intended to replace advice given to you by your health care provider. Make sure  you discuss any questions you have with your health care provider. Document Released: 10/30/2007 Document Revised: 01/21/2018 Document Reviewed: 01/21/2018 Elsevier Patient Education  2020 Elsevier Inc.  DASH Eating Plan DASH stands for "Dietary Approaches to Stop Hypertension." The DASH eating plan is a healthy eating plan that has been shown to reduce high blood pressure (hypertension). It may also reduce your risk for type 2 diabetes, heart disease, and stroke. The DASH eating plan may also help with weight loss. What are tips for following this plan?  General guidelines  Avoid eating more than 2,300 mg (milligrams) of salt (sodium) a day. If you have hypertension, you may need to reduce your sodium intake to 1,500 mg a day.  Limit alcohol intake to no more than 1 drink a day for nonpregnant women and 2 drinks a day for men. One drink equals 12 oz of beer, 5 oz of wine, or 1 oz of hard liquor.  Work with your health care provider to maintain a healthy body weight or to lose weight. Ask what an ideal weight is for you.  Get at least 30 minutes of exercise that causes your heart to beat faster (aerobic exercise) most days of the week. Activities may include  walking, swimming, or biking.  Work with your health care provider or diet and nutrition specialist (dietitian) to adjust your eating plan to your individual calorie needs. Reading food labels   Check food labels for the amount of sodium per serving. Choose foods with less than 5 percent of the Daily Value of sodium. Generally, foods with less than 300 mg of sodium per serving fit into this eating plan.  To find whole grains, look for the word "whole" as the first word in the ingredient list. Shopping  Buy products labeled as "low-sodium" or "no salt added."  Buy fresh foods. Avoid canned foods and premade or frozen meals. Cooking  Avoid adding salt when cooking. Use salt-free seasonings or herbs instead of table salt or sea salt. Check with your health care provider or pharmacist before using salt substitutes.  Do not fry foods. Cook foods using healthy methods such as baking, boiling, grilling, and broiling instead.  Cook with heart-healthy oils, such as olive, canola, soybean, or sunflower oil. Meal planning  Eat a balanced diet that includes: ? 5 or more servings of fruits and vegetables each day. At each meal, try to fill half of your plate with fruits and vegetables. ? Up to 6-8 servings of whole grains each day. ? Less than 6 oz of lean meat, poultry, or fish each day. A 3-oz serving of meat is about the same size as a deck of cards. One egg equals 1 oz. ? 2 servings of low-fat dairy each day. ? A serving of nuts, seeds, or beans 5 times each week. ? Heart-healthy fats. Healthy fats called Omega-3 fatty acids are found in foods such as flaxseeds and coldwater fish, like sardines, salmon, and mackerel.  Limit how much you eat of the following: ? Canned or prepackaged foods. ? Food that is high in trans fat, such as fried foods. ? Food that is high in saturated fat, such as fatty meat. ? Sweets, desserts, sugary drinks, and other foods with added sugar. ? Full-fat dairy  products.  Do not salt foods before eating.  Try to eat at least 2 vegetarian meals each week.  Eat more home-cooked food and less restaurant, buffet, and fast food.  When eating at a restaurant, ask that your  food be prepared with less salt or no salt, if possible. What foods are recommended? The items listed may not be a complete list. Talk with your dietitian about what dietary choices are best for you. Grains Whole-grain or whole-wheat bread. Whole-grain or whole-wheat pasta. Brown rice. Orpah Cobb. Bulgur. Whole-grain and low-sodium cereals. Pita bread. Low-fat, low-sodium crackers. Whole-wheat flour tortillas. Vegetables Fresh or frozen vegetables (raw, steamed, roasted, or grilled). Low-sodium or reduced-sodium tomato and vegetable juice. Low-sodium or reduced-sodium tomato sauce and tomato paste. Low-sodium or reduced-sodium canned vegetables. Fruits All fresh, dried, or frozen fruit. Canned fruit in natural juice (without added sugar). Meat and other protein foods Skinless chicken or [place]. Ground chicken or [place]. Pork with fat trimmed off. Fish and seafood. Egg whites. Dried beans, peas, or lentils. Unsalted nuts, nut butters, and seeds. Unsalted canned beans. Lean cuts of beef with fat trimmed off. Low-sodium, lean deli meat. Dairy Low-fat (1%) or fat-free (skim) milk. Fat-free, low-fat, or reduced-fat cheeses. Nonfat, low-sodium ricotta or cottage cheese. Low-fat or nonfat yogurt. Low-fat, low-sodium cheese. Fats and oils Soft margarine without trans fats. Vegetable oil. Low-fat, reduced-fat, or light mayonnaise and salad dressings (reduced-sodium). Canola, safflower, olive, soybean, and sunflower oils. Avocado. Seasoning and other foods Herbs. Spices. Seasoning mixes without salt. Unsalted popcorn and pretzels. Fat-free sweets. What foods are not recommended? The items listed may not be a complete list. Talk with your dietitian about what dietary choices are best for  you. Grains Baked goods made with fat, such as croissants, muffins, or some breads. Dry pasta or rice meal packs. Vegetables Creamed or fried vegetables. Vegetables in a cheese sauce. Regular canned vegetables (not low-sodium or reduced-sodium). Regular canned tomato sauce and paste (not low-sodium or reduced-sodium). Regular tomato and vegetable juice (not low-sodium or reduced-sodium). Rosita Fire. Olives. Fruits Canned fruit in a light or heavy syrup. Fried fruit. Fruit in cream or butter sauce. Meat and other protein foods Fatty cuts of meat. Ribs. Fried meat. Tomasa Blase. Sausage. Bologna and other processed lunch meats. Salami. Fatback. Hotdogs. Bratwurst. Salted nuts and seeds. Canned beans with added salt. Canned or smoked fish. Whole eggs or egg yolks. Chicken or [place] with skin. Dairy Whole or 2% milk, cream, and half-and-half. Whole or full-fat cream cheese. Whole-fat or sweetened yogurt. Full-fat cheese. Nondairy creamers. Whipped toppings. Processed cheese and cheese spreads. Fats and oils Butter. Stick margarine. Lard. Shortening. Ghee. Bacon fat. Tropical oils, such as coconut, palm kernel, or palm oil. Seasoning and other foods Salted popcorn and pretzels. Onion salt, garlic salt, seasoned salt, table salt, and sea salt. Worcestershire sauce. Tartar sauce. Barbecue sauce. Teriyaki sauce. Soy sauce, including reduced-sodium. Steak sauce. Canned and packaged gravies. Fish sauce. Oyster sauce. Cocktail sauce. Horseradish that you find on the shelf. Ketchup. Mustard. Meat flavorings and tenderizers. Bouillon cubes. Hot sauce and Tabasco sauce. Premade or packaged marinades. Premade or packaged taco seasonings. Relishes. Regular salad dressings. Where to find more information:  National Heart, Lung, and Blood Institute: PopSteam.is  American Heart Association: www.heart.org Summary  The DASH eating plan is a healthy eating plan that has been shown to reduce high blood pressure  (hypertension). It may also reduce your risk for type 2 diabetes, heart disease, and stroke.  With the DASH eating plan, you should limit salt (sodium) intake to 2,300 mg a day. If you have hypertension, you may need to reduce your sodium intake to 1,500 mg a day.  When on the DASH eating plan, aim to eat more fresh fruits and vegetables,  whole grains, lean proteins, low-fat dairy, and heart-healthy fats.  Work with your health care provider or diet and nutrition specialist (dietitian) to adjust your eating plan to your individual calorie needs. This information is not intended to replace advice given to you by your health care provider. Make sure you discuss any questions you have with your health care provider. Document Released: 05/02/2011 Document Revised: 04/25/2017 Document Reviewed: 05/06/2016 Elsevier Patient Education  2020 [employer].

## 2019-02-17 NOTE — Progress Notes (Signed)
Patient Name: [name] Date of Birth: 1980-10-17 Date of Visit: 02/19/19 PCP: Nicki Guadalajara, MD  Chief Complaint: Establishing care for hypertension concerns, itching, lower abdominal pain  Subjective: [name] is a 38 y.o. with medical history significant for HTN presenting today to establish care as a new patient.   HTN: KASYN STOUFFER states she was informed that she had elevated blood pressure measurements and is here to follow up as advised per her previous provider.  Patient states that she does not take her blood pressure at home.  Patient reports no headaches but does report intermittent dizziness and lightheadedness.  She denies headaches, blurry vision, lower extremity swelling and recently had an eye examination in which she had no retinopathy changes due to elevated blood pressure.  Of note, patient's blood pressure is 142/80 in office today.  Pruritus: Patient reports that she has daily pruritus that is localized on her back and on bilateral upper extremities for the past month.  She states that she is tried hydrocortisone and various itching creams with no relief of her symptoms.  She denies any changes in her laundry detergent or bath soaps but just continues to have itching and dry skin.  Patient reports that she would like suggestions on moisturizers for her skin that could help with this symptom.  She denies any new lesions or rashes in the places of itching and has not tried Zyrtec or Claritin or Benadryl for the itching.  Lower abdominal cramps: Patient reports having lower abdominal cramping focused on her right and left abdomen.  She reports having tubal ligation and has a history of ectopic pregnancy over 10 years ago.  She states that the pain varies from 3-9/10 and occurs on a daily basis patient states she has no relief with taking ibuprofen.  Patient states that she has an irregular menstrual cycle and has a history of not having a cycle for  1-2 months.  Smoking cessation: Patient states that she continues to smoke near one half a pack of cigarettes per day.  Patient states that she would like to stop smoking as soon as possible and feels confident that she will be able to quit within 2 months with assistance from pharmacological therapy.  Patient is requesting to start Chantix prescription to help with smoking cessation.  I have reviewed the patient's medical, surgical, family, and social history as appropriate.  Vitals:   02/17/19 1013  BP: (!) 142/80  Pulse: 82  SpO2: 99%    Physical Exam:   General: Alert and cooperative and appears to be in no acute distress HEENT: Neck non-tender without lymphadenopathy, masses or thyromegaly Cardio: Normal S1 and S2, no S3 or S4. Rhythm is regular. No murmurs or rubs.   Pulm: Clear to auscultation bilaterally, no crackles, wheezing, or diminished breath sounds. Normal respiratory effort. Abdomen: Bowel sounds normal. Abdomen soft, non-tender on superficial and deep palpation of all quadrants of abdomen as well as suprapubic region.  No masses palpated.  No hepato-megaly appreciated on exam. Extremities: No peripheral edema. Warm/ well perfused.  Strong radial and pedal pulses. Neuro: Patient is alert and oriented x3  Assessment & Plan:   Routine adult health maintenance -Mammogram ordered by previous provider -Influenza vaccine offered, patient plans to receive this vaccine at her place of employment -Tetanus vaccine offered, patient reports that she is up-to-date on this vaccine  Elevated BP without diagnosis of hypertension Patient's blood pressure is 142/80 in office today.  Patient reports intermittent dizziness  with lightheadedness but denies headache, blurry vision or extremity swelling.  Of note patient has had recent eye exam with no retinopathy related to elevated blood pressure.  Patient counseled on avoiding salt in her diet and encouraged to record blood pressures  regularly for the next few weeks.  Patient agreeable with this plan -Patient given information on hypertension and diet and exercise to help lower blood pressure measurements -Patient encouraged to record her blood pressures at home -Will not start patient on antihypertensive medication at this time -CMP collected today, normal  Pruritus Patient counseled on using moisturizing soaps in the, orally creams and lotions to moisturize skin soon after getting out of the shower.  Patient also counseled to avoid taking extremely hot showers to avoid drying out her skin.  Patient would like to try a lady products to moisturize her skin. -Suggested patient try Eucerin, Aveeno soaps and moisturizers -Patient encouraged to avoid hot showers -Patient encouraged to try Zyrtec or Claritin to help with itching  Tobacco use Patient is requesting prescription for Chantix to help with smoking cessation.  Patient counseled on intermittent increase of dosing on Chantix.  Patient verbalized understanding -Prescribed Chantix  Lower abdominal pain Patient reports bilateral abdominal pain that has been going on for over 1 month.  Patient has been hospitalized for this problem with no resolution.  Discussed possibility of considering a hysterectomy in the future as patient has had bilateral tubal ligation and does not have interest in having anymore children.  Recommended that patient follow-up with her OB/GYN provider to further discuss her options. -UA completed today, normal    Return to care in 1 month.  Stark Klein, MD  Family Medicine  PGY1

## 2019-02-17 NOTE — Assessment & Plan Note (Addendum)
-  Mammogram ordered by previous provider -Influenza vaccine offered, patient plans to receive this vaccine at her place of employment -Tetanus vaccine offered, patient reports that she is up-to-date on this vaccine

## 2019-02-18 LAB — COMPREHENSIVE METABOLIC PANEL
ALT: 27 IU/L (ref 0–32)
AST: 22 IU/L (ref 0–40)
Albumin/Globulin Ratio: 1.4 (ref 1.2–2.2)
Albumin: 4.6 g/dL (ref 3.8–4.8)
Alkaline Phosphatase: 98 IU/L (ref 39–117)
BUN/Creatinine Ratio: 13 (ref 9–23)
BUN: 10 mg/dL (ref 6–24)
Bilirubin Total: 0.3 mg/dL (ref 0.0–1.2)
CO2: 16 mmol/L — ABNORMAL LOW (ref 20–29)
Calcium: 9.6 mg/dL (ref 8.7–10.2)
Chloride: 100 mmol/L (ref 96–106)
Creatinine, Ser: 0.78 mg/dL (ref 0.57–1.00)
GFR calc Af Amer: 110 mL/min/{1.73_m2} (ref >59)
GFR calc non Af Amer: 95 mL/min/{1.73_m2} (ref >59)
Globulin, Total: 3.2 g/dL (ref 1.5–4.5)
Glucose: 78 mg/dL (ref 65–99)
Potassium: 4.4 mmol/L (ref 3.5–5.2)
Sodium: 137 mmol/L (ref 134–144)
Total Protein: 7.8 g/dL (ref 6.0–8.5)

## 2019-02-19 DIAGNOSIS — Z720 Tobacco use: Secondary | ICD-10-CM | POA: Insufficient documentation

## 2019-02-19 DIAGNOSIS — R1030 Lower abdominal pain, unspecified: Secondary | ICD-10-CM | POA: Insufficient documentation

## 2019-02-19 DIAGNOSIS — L299 Pruritus, unspecified: Secondary | ICD-10-CM | POA: Insufficient documentation

## 2019-02-19 NOTE — Assessment & Plan Note (Signed)
Patient is requesting prescription for Chantix to help with smoking cessation.  Patient counseled on intermittent increase of dosing on Chantix.  Patient verbalized understanding -Prescribed Chantix

## 2019-02-19 NOTE — Assessment & Plan Note (Addendum)
Patient's blood pressure is 142/80 in office today.  Patient reports intermittent dizziness with lightheadedness but denies headache, blurry vision or extremity swelling.  Of note patient has had recent eye exam with no retinopathy related to elevated blood pressure.  Patient counseled on avoiding salt in her diet and encouraged to record blood pressures regularly for the next few weeks.  Patient agreeable with this plan -Patient given information on hypertension and diet and exercise to help lower blood pressure measurements -Patient encouraged to record her blood pressures at home -Will not start patient on antihypertensive medication at this time -CMP collected today, normal

## 2019-02-19 NOTE — Assessment & Plan Note (Signed)
Patient reports bilateral abdominal pain that has been going on for over 1 month.  Patient has been hospitalized for this problem with no resolution.  Discussed possibility of considering a hysterectomy in the future as patient has had bilateral tubal ligation and does not have interest in having anymore children.  Recommended that patient follow-up with her OB/GYN provider to further discuss her options. -UA completed today, normal

## 2019-02-19 NOTE — Assessment & Plan Note (Signed)
Patient counseled on using moisturizing soaps in the, orally creams and lotions to moisturize skin soon after getting out of the shower.  Patient also counseled to avoid taking extremely hot showers to avoid drying out her skin.  Patient would like to try a lady products to moisturize her skin. -Suggested patient try Eucerin, Aveeno soaps and moisturizers -Patient encouraged to avoid hot showers -Patient encouraged to try Zyrtec or Claritin to help with itching

## 2019-02-26 ENCOUNTER — Other Ambulatory Visit: Payer: Self-pay

## 2019-02-26 ENCOUNTER — Emergency Department (HOSPITAL_COMMUNITY)
Admission: EM | Admit: 2019-02-26 | Discharge: 2019-02-26 | Disposition: A | Discharge status: Home / Self Care | Payer: 59 | Attending: Emergency Medicine | Admitting: Emergency Medicine

## 2019-02-26 ENCOUNTER — Emergency Department (HOSPITAL_COMMUNITY): Payer: 59

## 2019-02-26 DIAGNOSIS — F17210 Nicotine dependence, cigarettes, uncomplicated: Secondary | ICD-10-CM | POA: Diagnosis not present

## 2019-02-26 DIAGNOSIS — S3992XA Unspecified injury of lower back, initial encounter: Secondary | ICD-10-CM | POA: Diagnosis present

## 2019-02-26 DIAGNOSIS — Z79899 Other long term (current) drug therapy: Secondary | ICD-10-CM | POA: Diagnosis not present

## 2019-02-26 DIAGNOSIS — S39012A Strain of muscle, fascia and tendon of lower back, initial encounter: Secondary | ICD-10-CM

## 2019-02-26 DIAGNOSIS — Y92410 Unspecified street and highway as the place of occurrence of the external cause: Secondary | ICD-10-CM | POA: Insufficient documentation

## 2019-02-26 DIAGNOSIS — Y999 Unspecified external cause status: Secondary | ICD-10-CM | POA: Insufficient documentation

## 2019-02-26 DIAGNOSIS — Y939 Activity, unspecified: Secondary | ICD-10-CM | POA: Insufficient documentation

## 2019-02-26 DIAGNOSIS — S29012A Strain of muscle and tendon of back wall of thorax, initial encounter: Secondary | ICD-10-CM | POA: Diagnosis not present

## 2019-02-26 DIAGNOSIS — I10 Essential (primary) hypertension: Secondary | ICD-10-CM | POA: Insufficient documentation

## 2019-02-26 LAB — POC URINE PREG, ED: Preg Test, Ur: NEGATIVE

## 2019-02-26 MED ORDER — METHOCARBAMOL 500 MG PO TABS
1000.0000 mg | ORAL_TABLET | Freq: Once | ORAL | Status: AC
  Administered 2019-02-26: 21:00:00 1000 mg via ORAL
  Filled 2019-02-26: qty 2

## 2019-02-26 MED ORDER — ACETAMINOPHEN 500 MG PO TABS
1000.0000 mg | ORAL_TABLET | Freq: Once | ORAL | Status: AC
  Administered 2019-02-26: 1000 mg via ORAL
  Filled 2019-02-26: qty 2

## 2019-02-26 MED ORDER — NAPROXEN 250 MG PO TABS
500.0000 mg | ORAL_TABLET | Freq: Once | ORAL | Status: AC
  Administered 2019-02-26: 500 mg via ORAL
  Filled 2019-02-26: qty 2

## 2019-02-26 MED ORDER — DICLOFENAC SODIUM 1 % TD GEL: 2.0000 g | Freq: Four times a day (QID) | TRANSDERMAL | 0 refills | Status: DC

## 2019-02-26 MED ORDER — METHOCARBAMOL 500 MG PO TABS: 500.0000 mg | ORAL_TABLET | Freq: Three times a day (TID) | ORAL | 0 refills | Status: AC
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
## 2019-03-01 ENCOUNTER — Ambulatory Visit: Payer: 59

## 2019-03-03 ENCOUNTER — Other Ambulatory Visit: Payer: Self-pay

## 2019-03-03 ENCOUNTER — Ambulatory Visit (INDEPENDENT_AMBULATORY_CARE_PROVIDER_SITE_OTHER): Payer: 59 | Admitting: Family Medicine

## 2019-03-03 VITALS — BP 132/80 | HR 84 | Ht 64.0 in | Wt 211.1 lb

## 2019-03-03 DIAGNOSIS — M545 Low back pain, unspecified: Secondary | ICD-10-CM

## 2019-03-03 MED ORDER — CYCLOBENZAPRINE HCL 10 MG PO TABS: 10.0000 mg | ORAL_TABLET | Freq: Three times a day (TID) | ORAL | 0 refills | Status: AC | PRN

## 2019-03-03 MED ORDER — DICLOFENAC SODIUM 1 % TD GEL: 2.0000 g | Freq: Four times a day (QID) | TRANSDERMAL | 0 refills | Status: DC
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
## 2019-03-03 NOTE — Patient Instructions (Signed)
Dear [name],   It was good to see you! Thank you for taking your time to come in to be seen. Today, we discussed the following:   Back Pain    Sending Flexeril to the pharmacy. This is a different type of muscle relaxant and will hopefully help with the pain. I anticipate the pain will resolve in the next coming days   Use heating pads, continue to topical pain relieving gel   If you have worsening shooting pain or numbness, please call for follow up.   If you experience any bladder or bowel incontinence, please seek care at the ED.  Be well,   Zettie Cooley, M.D   [facility] [phone]  *Sign up for MyChart for instant access to your health profile, labs, orders, upcoming appointments or to contact your provider with questions*  ===================================================================================

## 2019-03-04 ENCOUNTER — Encounter: Payer: Self-pay | Admitting: Family Medicine

## 2019-03-04 DIAGNOSIS — M545 Low back pain, unspecified: Secondary | ICD-10-CM | POA: Insufficient documentation
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
## 2019-03-05 ENCOUNTER — Telehealth: Payer: Self-pay | Admitting: *Deleted

## 2019-03-05 NOTE — Telephone Encounter (Signed)
Medication denied.  If you would like to appeal, see number below:  CaseId:[id number];Status:Denied;Review Type:Prior Auth;Appeal Information: Attention:ATTN: [place] [id number]. HRCBU,LA,[id number] OZYYQ:[phone] GQB:[phone];  To provider. Christen Bame, CMA

## 2019-03-05 NOTE — Telephone Encounter (Signed)
Received fax from pharmacy, PA needed on voltaren.  Clinical questions submitted via Cover My Meds.  Waiting on response, could take up to 72 hours.  Cover My Meds info: Key: [id number]  Christen Bame, CMA

## 2019-06-04 ENCOUNTER — Other Ambulatory Visit: Payer: Self-pay

## 2019-06-04 ENCOUNTER — Telehealth (INDEPENDENT_AMBULATORY_CARE_PROVIDER_SITE_OTHER): Payer: 59 | Admitting: Family Medicine

## 2019-06-04 ENCOUNTER — Encounter: Payer: Self-pay | Admitting: Family Medicine

## 2019-06-04 DIAGNOSIS — Z20822 Contact with and (suspected) exposure to covid-19: Secondary | ICD-10-CM | POA: Diagnosis not present

## 2019-06-04 NOTE — Progress Notes (Signed)
[facility] Telemedicine Visit  Patient consented to have virtual visit. Method of visit: Telephone  Encounter participants: Patient: [name] - located at home address  Provider: Nicki Guadalajara - located at provider's home address  Others (if applicable): none   Chief Complaint: loss sense   HPI:  Loss of Smell and Taste with recent COVID exposure [name] states that she lost her smell and taste on Wednesday. At her job, they take random COVID tests. Her results from Tuesday were negative but her daughter works at the same location and her results came back positive. She was instructed to quarantine as she and her daughter carpool to work together. Patient states she has been quarantining in her home since then and is requesting recommendations for OTC medications to help with relieving symptoms if they develop. Patient states her only symptoms at this time are loss of smell and taste. She denies any SOB, coughing, chest pain or tightness, GI upset, nausea, decreased appetite or body aches. I informed patient that it may take a few weeks for her smell and taste to return to which she verbalized understanding.     ROS: per HPI, denies SOB, coughing, chest pain or tightness, denies GI upset, denies HA, denies fevers, chills, denies congestion, denies new rashes, joint pain or swelling in joints   Pertinent PMHx: HTN  Exam:  Respiratory: no signs of respiratory distress, no coughing, speaking in full and clear sentences   Assessment/Plan:  Exposure to COVID-19 virus Patient recently with negative COVID-19 test 3 days ago but lost senses of taste and smell. Daughter has tested + so patient is now quarantined for 10 days, per work.  -emphasized importance of remaining hydrated  -gave OTC recommendations for medications if symptoms develop  -encouraged rest and monitoring for development of new symptoms -precautions reviewed for visiting ED if  respiratory status begins to decline  -recommended Tylenol if fever develops -patient verbalized understanding of precautions and was in agreement with this plan     Time spent during visit with patient: 13 minutes

## 2019-06-04 NOTE — Assessment & Plan Note (Signed)
Patient recently with negative COVID-19 test 3 days ago but lost senses of taste and smell. Daughter has tested + so patient is now quarantined for 10 days, per work.  -emphasized importance of remaining hydrated  -gave OTC recommendations for medications if symptoms develop  -encouraged rest and monitoring for development of new symptoms -precautions reviewed for visiting ED if respiratory status begins to decline  -recommended Tylenol if fever develops -patient verbalized understanding of precautions and was in agreement with this plan

## 2020-01-26 ENCOUNTER — Ambulatory Visit: Payer: 59 | Admitting: Obstetrics and Gynecology

## 2020-09-07 ENCOUNTER — Other Ambulatory Visit: Payer: Self-pay

## 2020-09-07 ENCOUNTER — Encounter (HOSPITAL_COMMUNITY): Payer: Self-pay

## 2020-09-07 ENCOUNTER — Emergency Department (HOSPITAL_COMMUNITY)
Admission: EM | Admit: 2020-09-07 | Discharge: 2020-09-07 | Discharge status: Against Medical Advice | Payer: BLUE CROSS/BLUE SHIELD | Attending: Emergency Medicine | Admitting: Emergency Medicine

## 2020-09-07 DIAGNOSIS — I10 Essential (primary) hypertension: Secondary | ICD-10-CM | POA: Diagnosis not present

## 2020-09-07 DIAGNOSIS — R519 Headache, unspecified: Secondary | ICD-10-CM | POA: Diagnosis not present

## 2020-09-07 DIAGNOSIS — Z8616 Personal history of COVID-19: Secondary | ICD-10-CM | POA: Insufficient documentation

## 2020-09-07 DIAGNOSIS — R42 Dizziness and giddiness: Secondary | ICD-10-CM | POA: Insufficient documentation

## 2020-09-07 DIAGNOSIS — F17200 Nicotine dependence, unspecified, uncomplicated: Secondary | ICD-10-CM | POA: Insufficient documentation

## 2020-09-07 DIAGNOSIS — R079 Chest pain, unspecified: Secondary | ICD-10-CM | POA: Diagnosis not present

## 2020-09-07 MED ORDER — MECLIZINE HCL 25 MG PO TABS: 25.0000 mg | ORAL_TABLET | Freq: Three times a day (TID) | ORAL | 0 refills | Status: DC | PRN

## 2020-09-07 NOTE — ED Notes (Signed)
EDP says pt is leaving AMA. I am administering blood and cannot leave another pts room at this time.

## 2020-09-07 NOTE — Discharge Instructions (Signed)
You have chosen to leave the emergency department AGAINST MEDICAL ADVICE.  I have explained to you your testing results and the need for further evaluation/admission.  You have verbalized understanding of these results and plan and are choosing to leave the hospital AGAINST MEDICAL ADVICE. You understand the risks associated with leaving without further evaluation/treatment. If you have any worsening symptoms or choose to be treated please return immediately to emergency department. 

## 2020-09-07 NOTE — ED Triage Notes (Signed)
Pt bib by ems from work with dizziness with standing/walking and HTN. 200/125 195/119. Pt took a friends 25mg  HCTZ around 1230. Pt has mild N, denies HA .

## 2020-09-07 NOTE — ED Notes (Signed)
Pt placed call to PCP to follow up on bp and to eye doctor.

## 2020-09-07 NOTE — ED Provider Notes (Signed)
[facility] EMERGENCY DEPARTMENT Provider Note   CSN: [id number] Arrival date & time: 09/07/20  1424     History Chief Complaint  Patient presents with  . Dizziness    [name] is a 40 y.o. female.  HPI   40 year old female past medical history of HTN, currently not on any medications presents the emergency department with concern for an episode of dizziness and hypertension.  Patient states for the past couple months she has been having intermittent episodes of dizziness that she describes as the room spinning.  It happens with physical activity and position changes.  It lasts couple minutes, self resolves.  Sometimes she has associated chest heaviness.  She states today while at work and picking up plates she had a more severe episode that caused her to lean against the wall.  It did self resolve, there was some mild chest heaviness.  This is also resolved.  When she went to the clinic she was found to be hypertensive with a systolic over 200, she took her friend's pill of hydrochlorothiazide and was recommended to come here for evaluation.  She never had any acute headache.  She has been having chronic blurred vision and has an appointment with her ophthalmologist to get her glasses adjusted, no vision loss or other visual disturbances.  She has never had any dizziness at rest, in between the episodes she is back to baseline.  Denies any fever, leg swelling or other acute illness.  Past Medical History:  Diagnosis Date  . HTN (hypertension)     Patient Active Problem List   Diagnosis Date Noted  . Exposure to COVID-19 virus 06/04/2019  . Acute bilateral low back pain without sciatica 03/04/2019  . Pruritus 02/19/2019  . Tobacco use 02/19/2019  . Lower abdominal pain 02/19/2019  . Routine adult health maintenance 02/17/2019  . Elevated BP without diagnosis of hypertension 02/17/2019  . Left tubal pregnancy without intrauterine pregnancy   . Ectopic  pregnancy without intrauterine pregnancy   . Acute appendicitis 12/06/2011    Past Surgical History:  Procedure Laterality Date  . CESAREAN SECTION  2007  . DIAGNOSTIC LAPAROSCOPY WITH REMOVAL OF ECTOPIC PREGNANCY N/A 05/18/2016   Procedure: DIAGNOSTIC LAPAROSCOPY WITH LEFT SALPINGECTOMY;  Surgeon: Catalina Antigua, MD;  Location: [facility];  Service: Gynecology;  Laterality: N/A;  . DILATION AND CURETTAGE OF UTERUS    . LAPAROSCOPIC APPENDECTOMY  12/05/2011   Procedure: APPENDECTOMY LAPAROSCOPIC;  Surgeon: Ardeth Sportsman, MD;  Location: [facility];  Service: General;  Laterality: N/A;  . TUBAL LIGATION       OB History    Gravida  4   Para  2   Term  1   Preterm      AB  2   Living        SAB  1   IAB      Ectopic  1   Multiple      Live Births              History reviewed. No pertinent family history.  Social History   Tobacco Use  . Smoking status: Current Every Day Smoker    Packs/day: 0.50    Years: 8.00    Pack years: 4.00  . Smokeless tobacco: Never Used  Substance Use Topics  . Alcohol use: Yes    Comment: 6 pack of beer per week  . Drug use: No    Home Medications Prior to Admission medications  Medication Sig Start Date End Date Taking? Authorizing Provider  meclizine (ANTIVERT) 25 MG tablet Take 1 tablet (25 mg total) by mouth 3 (three) times daily as needed for dizziness. 09/07/20  Yes Zenna Traister, Clabe Seal, DO  diclofenac sodium (VOLTAREN) 1 % GEL Apply 2 g topically 4 (four) times daily. 03/03/19   Melene Plan, MD  docusate sodium (COLACE) 100 MG capsule Take 1 capsule (100 mg total) by mouth 2 (two) times daily as needed. Patient not taking: Reported on 01/13/2019 05/18/16   Constant, Peggy, MD  doxycycline (VIBRAMYCIN) 100 MG capsule Take 1 capsule (100 mg total) by mouth 2 (two) times daily. Patient not taking: Reported on 01/13/2019 10/07/18   Petrucelli, Pleas Koch, PA-C  ibuprofen (ADVIL,MOTRIN) 600 MG tablet Take 1 tablet (600 mg total) by  mouth every 6 (six) hours as needed. Patient not taking: Reported on 01/13/2019 05/18/16   Constant, Peggy, MD  metroNIDAZOLE (FLAGYL) 500 MG tablet Take 1 tablet (500 mg total) by mouth 2 (two) times daily. Patient not taking: Reported on 01/13/2019 10/07/18   Petrucelli, Pleas Koch, PA-C  metroNIDAZOLE (FLAGYL) 500 MG tablet Take 1 tablet (500 mg total) by mouth 2 (two) times daily. 01/18/19   Conan Bowens, MD  naproxen (NAPROSYN) 500 MG tablet Take 1 tablet (500 mg total) by mouth 2 (two) times daily. Patient not taking: Reported on 01/13/2019 10/07/18   Petrucelli, Pleas Koch, PA-C  nitrofurantoin, macrocrystal-monohydrate, (MACROBID) 100 MG capsule Take 1 capsule (100 mg total) by mouth 2 (two) times daily. 01/18/19   Conan Bowens, MD  oxyCODONE-acetaminophen (PERCOCET/ROXICET) 5-325 MG tablet Take 1 tablet by mouth every 4 (four) hours as needed. Patient not taking: Reported on 01/13/2019 05/22/16   Hermina Staggers, MD  psyllium (HYDROCIL/METAMUCIL) 95 % PACK Take 1 packet by mouth 2 (two) times daily as needed. Patient not taking: Reported on 01/13/2019 12/06/11   Sherrie George, PA-C  varenicline (CHANTIX) 0.5 MG tablet Take 1 tablet (0.5 mg total) by mouth daily. 02/17/19   Simmons-Robinson, Tawanna Cooler, MD    Allergies    Patient has no known allergies.  Review of Systems   Review of Systems  Constitutional: Negative for chills and fever.  HENT: Negative for congestion.   Eyes: Negative for visual disturbance.  Respiratory: Positive for chest tightness. Negative for shortness of breath.   Cardiovascular: Negative for chest pain.  Gastrointestinal: Negative for abdominal pain, diarrhea and vomiting.  Genitourinary: Negative for dysuria.  Skin: Negative for rash.  Neurological: Positive for dizziness. Negative for headaches.    Physical Exam Updated Vital Signs BP (!) 151/97   Pulse 83 Comment: RA  Temp 98 F (36.7 C) (Oral)   Resp 16   Ht 5\' 4"  (1.626 m)   Wt 96.6 kg   LMP  09/06/2020 (Exact Date)   SpO2 100% Comment: RA  BMI 36.56 kg/m   Physical Exam Vitals and nursing note reviewed.  Constitutional:      Appearance: Normal appearance.  HENT:     Head: Normocephalic.     Mouth/Throat:     Mouth: Mucous membranes are moist.  Eyes:     Pupils: Pupils are equal, round, and reactive to light.  Cardiovascular:     Rate and Rhythm: Normal rate.  Pulmonary:     Effort: Pulmonary effort is normal. No respiratory distress.  Abdominal:     Palpations: Abdomen is soft.     Tenderness: There is no abdominal tenderness.  Skin:    General: Skin  is warm.  Neurological:     General: No focal deficit present.     Mental Status: She is alert and oriented to person, place, and time. Mental status is at baseline.  Psychiatric:        Mood and Affect: Mood normal.     ED Results / Procedures / Treatments   Labs (all labs ordered are listed, but only abnormal results are displayed) Labs Reviewed - No data to display  EKG None  Radiology No results found.  Procedures Procedures   Medications Ordered in ED Medications - No data to display  ED Course  I have reviewed the triage vital signs and the nursing notes.  Pertinent labs & imaging results that were available during my care of the patient were reviewed by me and considered in my medical decision making (see chart for details).    MDM Rules/Calculators/A&P                          40 year old female presents emergency department concern for dizziness and hypertension.  She is normotensive on arrival, vitals are otherwise normal.  She is neuro intact.  Currently asymptomatic.  Given the short duration of her episodes, with spontaneous resolution of symptoms, and the description of the dizziness this seems consistent with vertigo like symptoms. However with associated chest heaviness I recommended lab evaluation, EKG and treatment.  Patient declines and wants to leave. She is aaox3, has capacity.   States that she is hungry and has not eaten all day.  Her symptoms are completely gone, her blood pressure is normal and she does not want any evaluation.  She understands that an EKG, lab work and further care we cannot confirm that there is no life-threatening illness.  I have recommended that the patient stays in the department for the completion of their workup however they decline.  I have expressed the importance of staying including the risks of leaving which include worsening condition, permanent disability and death.  Patient accepts these risks.  They are alert and oriented, they have capacity to make this decision.  I have not been able to convince the patient to stay and they understand the risks of leaving AGAINST MEDICAL ADVICE.    Final Clinical Impression(s) / ED Diagnoses Final diagnoses:  Dizziness    Rx / DC Orders ED Discharge Orders         Ordered    meclizine (ANTIVERT) 25 MG tablet  3 times daily PRN        09/07/20 1607           Patrcia Schnepp, Clabe Seal, DO 09/07/20 1626

## 2020-09-07 NOTE — ED Notes (Signed)
Notified EDP that pt was hungry and wanted to leave.

## 2020-09-12 NOTE — Progress Notes (Signed)
[facility] Telemedicine Visit  Patient consented to have virtual visit and was identified by name and date of birth. Method of visit: Video  Encounter participants: Patient: [name] - located at Home Provider: Dollene Cleveland - located at Clinic Others (if applicable): None  Chief Complaint: Follow up re HTN  HPI:  HTN:  Patient recently seen in the ED for dizziness and HTN. 40 year old female with previously elevated blood pressure readings, currently not on any medications presents the emergency department with concern for an episode of dizziness and hypertension.    Patient had episode of intermittent dizziness, blurred vision and feeling of being unwell that occurred at work.  Patient had a blood pressure taken at work and her systolic was 180.  Her friend gave her 1 tablet of HCTZ 25 mg and called EMS.  Patient took the tablet of HCTZ, shortly after EMS arrived, rechecked her blood pressure and it was 220/110.  In the ED her blood pressure was relatively normotensive at 150 systolic.    Patient was scheduled for follow-up today 09/13/2020.  Patient reports that she tested positive for COVID on Friday 4/15. She is still congested, she is dizzy when she stands up.  She has not been checking her blood pressure at home.  She feels like it is still high because she is dizzy, blurred vision.  ROS: per HPI  Pertinent PMHx:  Patient Active Problem List   Diagnosis Date Noted  . Elevated blood pressure reading 09/13/2020  . Exposure to COVID-19 virus 06/04/2019  . Acute bilateral low back pain without sciatica 03/04/2019  . Pruritus 02/19/2019  . Tobacco use 02/19/2019  . Lower abdominal pain 02/19/2019  . Routine adult health maintenance 02/17/2019  . Elevated BP without diagnosis of hypertension 02/17/2019  . Left tubal pregnancy without intrauterine pregnancy   . Ectopic pregnancy without intrauterine pregnancy   . Acute appendicitis 12/06/2011      Exam:  LMP 09/06/2020 (Exact Date)   Respiratory: Comfortable work of breathing on room air  Assessment/Plan:  Elevated BP without diagnosis of hypertension Patient having worsening symptoms, blood pressure at times is noted to be elevated.  Patient can also potentially be having another issue that then creates her blood pressure to go up, such as vasovagal episode.  Patient would benefit from being seen in person. -Patient scheduled to come in tomorrow 09/14/2020 for in person visit in clinic, as patient was recently diagnosed with COVID will schedule appointment for later in the afternoon -Strict return precautions provided -No charge applied to visit as conversation was very short and no changes to patient's treatment were offered today  Time spent during visit with patient: 7 minutes   Peggyann Shoals, DO [facility], PGY-3 09/13/2020 1:47 PM

## 2020-09-13 ENCOUNTER — Ambulatory Visit: Payer: BLUE CROSS/BLUE SHIELD

## 2020-09-13 ENCOUNTER — Other Ambulatory Visit: Payer: Self-pay

## 2020-09-13 ENCOUNTER — Telehealth (INDEPENDENT_AMBULATORY_CARE_PROVIDER_SITE_OTHER): Payer: BLUE CROSS/BLUE SHIELD | Admitting: Family Medicine

## 2020-09-13 DIAGNOSIS — R030 Elevated blood-pressure reading, without diagnosis of hypertension: Secondary | ICD-10-CM

## 2020-09-13 NOTE — Assessment & Plan Note (Signed)
Patient having worsening symptoms, blood pressure at times is noted to be elevated.  Patient can also potentially be having another issue that then creates her blood pressure to go up, such as vasovagal episode.  Patient would benefit from being seen in person. -Patient scheduled to come in tomorrow 09/14/2020 for in person visit in clinic, as patient was recently diagnosed with COVID will schedule appointment for later in the afternoon -Strict return precautions provided

## 2020-09-14 ENCOUNTER — Ambulatory Visit (INDEPENDENT_AMBULATORY_CARE_PROVIDER_SITE_OTHER): Payer: BLUE CROSS/BLUE SHIELD | Admitting: Family Medicine

## 2020-09-14 ENCOUNTER — Other Ambulatory Visit: Payer: Self-pay

## 2020-09-14 VITALS — BP 124/78 | HR 72 | Ht 64.0 in

## 2020-09-14 DIAGNOSIS — R42 Dizziness and giddiness: Secondary | ICD-10-CM

## 2020-09-14 DIAGNOSIS — U071 COVID-19: Secondary | ICD-10-CM

## 2020-09-14 DIAGNOSIS — H579 Unspecified disorder of eye and adnexa: Secondary | ICD-10-CM

## 2020-09-14 DIAGNOSIS — R030 Elevated blood-pressure reading, without diagnosis of hypertension: Secondary | ICD-10-CM | POA: Diagnosis not present

## 2020-09-14 NOTE — Patient Instructions (Signed)
It was wonderful to see you today.  It is very important that you make sure that you are drinking plenty of fluids and try to slowly add back into activity when you can.  COVID can very commonly can make you feel extremely fatigued and rundown, sometimes can take a while to increase back up your activity.  Please slowly change positions from laying down to sitting and then sitting from standing.  Please stand for several seconds prior to walking.  It is very important that you keep your eye appointment for them to evaluate your vision and take a look at that piece on your eye.  We will hold off on blood pressure medication for now, however like you to follow-up in 1 week to see how your blood pressure is doing at that time and then consider starting a medication if needed.  If you have any significant dizziness, shortness of breath, chest pain, or worsening blurred vision/headache please make sure you go to the ED.

## 2020-09-14 NOTE — Progress Notes (Signed)
SUBJECTIVE:   CHIEF COMPLAINT / HPI: Follow up HTN/COVID (+)   Ms. Oliveto is a 40 year old female presenting for a same day visit for follow-up of recent ED visit.  She also met virtually with Dr. Ouida Sills yesterday.  Seen in the ED on 4/14 after having significantly elevated BP with lightheadedness/dizziness episode.  Left before labs, however neurologically intact/evaluated by provider.  Then afterwards tested positive for COVID on 4/14 evening and 4/15 (home test and [facility]), initial symptoms on 4/14 as above.   Reports she is doing much better today compared to earlier this week, had profound fatigue. Still having some congestion and occasional cough remaining. Occasional headache with this illness, throbbing in sensation, improves with nap/rest. Feeling more winded with activity. No fever.   Feels lightheaded when she stands up, quick head movements, and with prolonged eye concentration for the past few months. She made an eye doctor appointment because she got new glasses and since wearing this prescription is when she first noticed the dizziness/lightheaded sensation. She is near sighted but now with these new glasses seeing closed is now blurred. If she takes the glasses off, can see fine close up as usual. Appt next Wednesday, at [facility].  She also has noticed this bump on her outer eye at least for the past several weeks, feels a little bit gritty sensation.  Denies any eye pain or difficulty with eye movements.  No eye injury or trauma, does not have a history of something getting into her eye.  Does not have a BP cuff at home.  Last seen in our office in person in 02/2019.  PERTINENT  PMH / PSH: Elevated blood pressure without formal diagnosis, tobacco use 1/2 ppd, intermittent back pain    OBJECTIVE:   BP 124/78   Pulse 72   Ht $R'5\' 4"'sk$  (1.626 m)   LMP 09/06/2020 (Exact Date)   SpO2 99%   BMI 36.56 kg/m   General: Alert, NAD HEENT: NCAT, MMM, oropharynx nonerythematous,  small 1 mm clear hypertrophied lesion present on lateral outer sclera of right eye.  EOMI without pain, PERRLA.  No conjunctival injection.  Does have perilimbal brown hyperpigmentation. Cardiac: RRR no m/g/r Lungs: Clear bilaterally, no increased WOB on RA, able to speak in full sentences without difficulty Abdomen: soft Msk: Moves all extremities spontaneously  Ext: Warm, dry, 2+ distal pulses, no edema b/l   Orthostatic vitals: Lying: 122/72, pulse 72 Sitting: 110/70, pulse 86 Standing: 110/86, pulse 84 Standing for 3 minutes: 114/76, pulse 76  ASSESSMENT/PLAN:   Elevated BP without diagnosis of hypertension Fortunately BP WNL today at 124/78, do question of her recent episode on 4/14 was actually her initial manifestation of undiagnosed COVID illness.  However, note that she has had several elevated blood pressures in the office several years ago and has not been on medication.  Given that her blood pressure is normal today and with acute illness, hesitant to start an antihypertensive at this time.  Recommended follow-up in 1 week to recheck BP and discuss possible medication pending value, would consider Norvasc given overall tolerability/no lab monitoring.  Check BMP today.  COVID-19 Tested positive with symptom onset on 4/14.  Mild illness with much improvement, remaining congestion, intermittent headache, fatigue and mild dyspnea.  Breathing comfortably during our evaluation without any concurrent hypoxia or tachycardia to suggest concern for PE.  Recommended continuing quarantine as she is through 10-day course if possible, or wearing an N95/well fitted mask as per CDC guidelines.  Episodic lightheadedness Chronic, appears multifactorial.  Fortunately neurologically intact with normal orthostatic vitals.  Does have some element of BPPV given lightheadedness/dizziness with some position changes, however unable to perform Dix-Hallpike today for further evaluation.  Additionally patient  noted this started after receiving new eyeglasses prescription that appeared to be incorrect, certainly could be contributing and has follow-up appointment with ophthalmologist next week.  Encouraged adequate hydration and slow change between positions.  Eye lesion Present for several weeks without change as a small clear lesion present within outer lateral sclera.  Unclear etiology, could consider Pinguecula, cyst, vs non-specific growth.  Additionally evaluated by Dr. Erin Hearing.  While unclear etiology, no concern for urgent evaluation for ophthalmology at this time.  Recommended maintaining appointment next week for evaluation.    Recommended follow-up in 1 week for the above concerns, sooner/ED if difficulty breathing, chest pain, confusion.  Consider Dix-Hallpike on follow-up visit.  Patriciaann Clan, Dix

## 2020-09-15 ENCOUNTER — Encounter: Payer: Self-pay | Admitting: Family Medicine

## 2020-09-15 DIAGNOSIS — R42 Dizziness and giddiness: Secondary | ICD-10-CM | POA: Insufficient documentation

## 2020-09-15 DIAGNOSIS — H579 Unspecified disorder of eye and adnexa: Secondary | ICD-10-CM | POA: Insufficient documentation

## 2020-09-15 DIAGNOSIS — U071 COVID-19: Secondary | ICD-10-CM | POA: Insufficient documentation

## 2020-09-15 LAB — BASIC METABOLIC PANEL
BUN/Creatinine Ratio: 9 (ref 9–23)
BUN: 8 mg/dL (ref 6–24)
CO2: 17 mmol/L — ABNORMAL LOW (ref 20–29)
Calcium: 9.1 mg/dL (ref 8.7–10.2)
Chloride: 107 mmol/L — ABNORMAL HIGH (ref 96–106)
Creatinine, Ser: 0.87 mg/dL (ref 0.57–1.00)
Glucose: 91 mg/dL (ref 65–99)
Potassium: 4 mmol/L (ref 3.5–5.2)
Sodium: 145 mmol/L — ABNORMAL HIGH (ref 134–144)
eGFR: 85 mL/min/{1.73_m2} (ref >59)

## 2020-09-15 NOTE — Assessment & Plan Note (Addendum)
Chronic, appears multifactorial.  Fortunately neurologically intact with normal orthostatic vitals.  Does have some element of BPPV given lightheadedness/dizziness with some position changes, however unable to perform Dix-Hallpike today for further evaluation.  Additionally patient noted this started after receiving new eyeglasses prescription that appeared to be incorrect, certainly could be contributing and has follow-up appointment with ophthalmologist next week.  Encouraged adequate hydration and slow change between positions.

## 2020-09-15 NOTE — Assessment & Plan Note (Signed)
Present for several weeks without change as a small clear lesion present within outer lateral sclera.  Unclear etiology, could consider Pinguecula, cyst, vs non-specific growth.  Additionally evaluated by Dr. Deirdre Priest.  While unclear etiology, no concern for urgent evaluation for ophthalmology at this time.  Recommended maintaining appointment next week for evaluation.

## 2020-09-15 NOTE — Assessment & Plan Note (Addendum)
Fortunately BP WNL today at 124/78, do question of her recent episode on 4/14 was actually her initial manifestation of undiagnosed COVID illness.  However, note that she has had several elevated blood pressures in the office several years ago and has not been on medication.  Given that her blood pressure is normal today and with acute illness, hesitant to start an antihypertensive at this time.  Recommended follow-up in 1 week to recheck BP and discuss possible medication pending value, would consider Norvasc given overall tolerability/no lab monitoring.  Check BMP today.

## 2020-09-15 NOTE — Assessment & Plan Note (Signed)
Tested positive with symptom onset on 4/14.  Mild illness with much improvement, remaining congestion, intermittent headache, fatigue and mild dyspnea.  Breathing comfortably during our evaluation without any concurrent hypoxia or tachycardia to suggest concern for PE.  Recommended continuing quarantine as she is through 10-day course if possible, or wearing an N95/well fitted mask as per CDC guidelines.

## 2020-09-27 ENCOUNTER — Ambulatory Visit: Payer: BLUE CROSS/BLUE SHIELD | Admitting: Family Medicine

## 2020-09-27 NOTE — Progress Notes (Deleted)
    SUBJECTIVE:   CHIEF COMPLAINT / HPI:   BP:   PERTINENT  PMH / PSH: recent covid infection  OBJECTIVE:   LMP 09/06/2020 (Exact Date)   ***  ASSESSMENT/PLAN:   No problem-specific Assessment & Plan notes found for this encounter.     Sandre Kitty, MD [facility]   {    This will disappear when note is signed, click to select method of visit    :1}

## 2021-03-12 ENCOUNTER — Encounter: Payer: BLUE CROSS/BLUE SHIELD | Admitting: Family Medicine

## 2021-04-30 ENCOUNTER — Other Ambulatory Visit: Payer: Self-pay

## 2021-04-30 ENCOUNTER — Ambulatory Visit (INDEPENDENT_AMBULATORY_CARE_PROVIDER_SITE_OTHER): Payer: BLUE CROSS/BLUE SHIELD | Admitting: Obstetrics and Gynecology

## 2021-04-30 ENCOUNTER — Other Ambulatory Visit (HOSPITAL_COMMUNITY)
Admission: RE | Admit: 2021-04-30 | Discharge: 2021-04-30 | Disposition: A | Discharge status: Home / Self Care | Payer: BLUE CROSS/BLUE SHIELD | Source: Ambulatory Visit | Attending: Obstetrics and Gynecology | Admitting: Obstetrics and Gynecology

## 2021-04-30 ENCOUNTER — Encounter: Payer: Self-pay | Admitting: Obstetrics and Gynecology

## 2021-04-30 VITALS — BP 140/91 | HR 85 | Ht 64.0 in | Wt 208.0 lb

## 2021-04-30 DIAGNOSIS — R102 Pelvic and perineal pain: Secondary | ICD-10-CM | POA: Diagnosis not present

## 2021-04-30 DIAGNOSIS — Z01419 Encounter for gynecological examination (general) (routine) without abnormal findings: Secondary | ICD-10-CM | POA: Diagnosis present

## 2021-04-30 NOTE — Progress Notes (Signed)
GYN presents for AEX/PAP/STD cultures.  BP elevated today.  C/o headache, dizziness, floaters, blurry vision.

## 2021-04-30 NOTE — Progress Notes (Signed)
Subjective:     [name] is a 40 y.o. female with BMI 35 who is here for a comprehensive physical exam. The patient reports right lower quadrant pelvic pain which has been present intermittently for the past few months. The pain is aggravated by intercourse and bowel movement. Patient reports an irregular period where she may skip a month occasionally. Patient denies any abnormal discharge. She denies urinary incontinence  Past Medical History:  Diagnosis Date   Acute appendicitis 12/06/2011   HTN (hypertension)    Past Surgical History:  Procedure Laterality Date   CESAREAN SECTION  2007   DIAGNOSTIC LAPAROSCOPY WITH REMOVAL OF ECTOPIC PREGNANCY N/A 05/18/2016   Procedure: DIAGNOSTIC LAPAROSCOPY WITH LEFT SALPINGECTOMY;  Surgeon: Catalina Antigua, MD;  Location: [facility];  Service: Gynecology;  Laterality: N/A;   DILATION AND CURETTAGE OF UTERUS     LAPAROSCOPIC APPENDECTOMY  12/05/2011   Procedure: APPENDECTOMY LAPAROSCOPIC;  Surgeon: Ardeth Sportsman, MD;  Location: [facility];  Service: General;  Laterality: N/A;   TUBAL LIGATION     No family history on file.   Social History   Socioeconomic History   Marital status: Single    Spouse name: Not on file   Number of children: Not on file   Years of education: Not on file   Highest education level: Not on file  Occupational History   Not on file  Tobacco Use   Smoking status: Every Day    Packs/day: 0.50    Years: 8.00    Pack years: 4.00    Types: Cigarettes   Smokeless tobacco: Never  Substance and Sexual Activity   Alcohol use: Yes    Comment: 6 pack of beer per week   Drug use: No   Sexual activity: Yes    Partners: Male    Birth control/protection: None  Other Topics Concern   Not on file  Social History Narrative   Not on file   Social Determinants of Health   Financial Resource Strain: Not on file  Food Insecurity: Not on file  Transportation Needs: Not on file  Physical Activity: Not on file  Stress:  Not on file  Social Connections: Not on file  Intimate Partner Violence: Not on file   Health Maintenance  Topic Date Due   COVID-19 Vaccine (1) Never done   Pneumococcal Vaccine 59-42 Years old (1 - PCV) Never done   MAMMOGRAM  Never done   TETANUS/TDAP  Never done   INFLUENZA VACCINE  Never done   PAP SMEAR-Modifier  01/12/2022   Hepatitis C Screening  Completed   HIV Screening  Completed   HPV VACCINES  Aged Out       Review of Systems Pertinent items noted in HPI and remainder of comprehensive ROS otherwise negative.   Objective:  Blood pressure (!) 140/91, pulse 85, height 5\' 4"  (1.626 m), weight 208 lb (94.3 kg), last menstrual period 03/20/2021.    GENERAL: Well-developed, well-nourished female in no acute distress.  HEENT: Normocephalic, atraumatic. Sclerae anicteric.  NECK: Supple. Normal thyroid.  LUNGS: Clear to auscultation bilaterally.  HEART: Regular rate and rhythm. BREASTS: Symmetric in size. No palpable masses or lymphadenopathy, skin changes, or nipple drainage. ABDOMEN: Soft, nontender, nondistended. No organomegaly. PELVIC: Normal external female genitalia. Vagina is pink and rugated.  Normal discharge. Normal appearing cervix. Uterus is normal in size. No adnexal mass or tenderness. Chaperone present during the pelvic exam EXTREMITIES: No cyanosis, clubbing, or edema, 2+ distal pulses.  Assessment:    Healthy female exam.      Plan:    Pap smear collected Screening mammogram ordered STI screening per patient request Pelvic ultrasound ordered to evaluate RLQ pain Patient will be contacted with abnormal results See After Visit Summary for Counseling Recommendations

## 2021-05-01 ENCOUNTER — Ambulatory Visit (HOSPITAL_BASED_OUTPATIENT_CLINIC_OR_DEPARTMENT_OTHER)
Admission: RE | Admit: 2021-05-01 | Discharge: 2021-05-01 | Disposition: A | Discharge status: Home / Self Care | Payer: BLUE CROSS/BLUE SHIELD | Source: Ambulatory Visit | Attending: Obstetrics and Gynecology | Admitting: Obstetrics and Gynecology

## 2021-05-01 ENCOUNTER — Other Ambulatory Visit: Payer: Self-pay | Admitting: Obstetrics and Gynecology

## 2021-05-01 DIAGNOSIS — R102 Pelvic and perineal pain: Secondary | ICD-10-CM | POA: Diagnosis present

## 2021-05-01 DIAGNOSIS — Z01419 Encounter for gynecological examination (general) (routine) without abnormal findings: Secondary | ICD-10-CM | POA: Insufficient documentation

## 2021-05-01 DIAGNOSIS — Z1231 Encounter for screening mammogram for malignant neoplasm of breast: Secondary | ICD-10-CM | POA: Insufficient documentation

## 2021-05-01 LAB — HEPATITIS B SURFACE ANTIGEN: Hepatitis B Surface Ag: NEGATIVE

## 2021-05-01 LAB — CERVICOVAGINAL ANCILLARY ONLY
Chlamydia: NEGATIVE
Comment: NEGATIVE
Comment: NORMAL
Neisseria Gonorrhea: NEGATIVE

## 2021-05-01 LAB — RPR: RPR Ser Ql: NONREACTIVE

## 2021-05-01 LAB — HIV ANTIBODY (ROUTINE TESTING W REFLEX): HIV Screen 4th Generation wRfx: NONREACTIVE

## 2021-05-01 LAB — HEPATITIS C ANTIBODY: Hep C Virus Ab: <0.1 s/co ratio (ref 0.0–0.9)

## 2021-05-03 LAB — CYTOLOGY - PAP
Comment: NEGATIVE
Diagnosis: NEGATIVE
High risk HPV: NEGATIVE

## 2021-05-08 ENCOUNTER — Ambulatory Visit: Payer: BLUE CROSS/BLUE SHIELD

## 2021-05-14 ENCOUNTER — Ambulatory Visit (INDEPENDENT_AMBULATORY_CARE_PROVIDER_SITE_OTHER): Payer: BLUE CROSS/BLUE SHIELD

## 2021-05-14 ENCOUNTER — Other Ambulatory Visit: Payer: Self-pay

## 2021-05-14 VITALS — BP 160/100 | HR 88 | Ht 64.0 in | Wt 207.0 lb

## 2021-05-14 DIAGNOSIS — R300 Dysuria: Secondary | ICD-10-CM

## 2021-05-14 LAB — POCT URINALYSIS DIPSTICK
Appearance: ABNORMAL
Bilirubin, UA: NEGATIVE
Glucose, UA: NEGATIVE
Ketones, UA: NEGATIVE
Nitrite, UA: POSITIVE
Protein, UA: POSITIVE — AB
Spec Grav, UA: >=1.03 — AB (ref 1.010–1.025)
Urobilinogen, UA: 0.2 E.U./dL
pH, UA: 6 (ref 5.0–8.0)

## 2021-05-14 MED ORDER — NITROFURANTOIN MONOHYD MACRO 100 MG PO CAPS: 100.0000 mg | ORAL_CAPSULE | Freq: Two times a day (BID) | ORAL | 0 refills | Status: DC

## 2021-05-14 NOTE — Progress Notes (Signed)
SUBJECTIVE: [name] is a 40 y.o. female who complains of urinary frequency, urgency and dysuria for several days, without flank pain, fever, chills, or abnormal vaginal discharge or bleeding.   OBJECTIVE: Appears well, in no apparent distress.  Vital signs are normal. Urine dipstick shows positive for WBC's, positive for RBC's, and positive for nitrates.    ASSESSMENT: Dysuria  PLAN: Treatment per orders.  Call or return to clinic prn if these symptoms worsen or fail to improve as anticipated.

## 2021-05-21 LAB — URINE CULTURE

## 2021-05-23 ENCOUNTER — Other Ambulatory Visit: Payer: Self-pay

## 2021-05-23 DIAGNOSIS — N390 Urinary tract infection, site not specified: Secondary | ICD-10-CM

## 2021-05-23 MED ORDER — CEPHALEXIN 500 MG PO CAPS: 500.0000 mg | ORAL_CAPSULE | Freq: Three times a day (TID) | ORAL | 0 refills | Status: DC

## 2021-06-19 ENCOUNTER — Ambulatory Visit (INDEPENDENT_AMBULATORY_CARE_PROVIDER_SITE_OTHER): Payer: 59 | Admitting: Family Medicine

## 2021-06-19 ENCOUNTER — Other Ambulatory Visit: Payer: Self-pay

## 2021-06-19 VITALS — BP 135/95 | HR 90 | Ht 64.0 in | Wt 208.8 lb

## 2021-06-19 DIAGNOSIS — M25512 Pain in left shoulder: Secondary | ICD-10-CM | POA: Diagnosis not present

## 2021-06-19 DIAGNOSIS — I10 Essential (primary) hypertension: Secondary | ICD-10-CM | POA: Insufficient documentation

## 2021-06-19 MED ORDER — AMLODIPINE BESYLATE 10 MG PO TABS: 10.0000 mg | ORAL_TABLET | Freq: Every day | ORAL | 3 refills | Status: DC

## 2021-06-19 MED ORDER — MELOXICAM 7.5 MG PO TABS: 7.5000 mg | ORAL_TABLET | Freq: Every day | ORAL | 0 refills | Status: DC

## 2021-06-19 NOTE — Assessment & Plan Note (Addendum)
Blood pressure today of 135/95.  Has had multiple previous elevated blood pressures during office visits.  We will initiate amlodipine 10mg  daily.  Follow-up in 1 week for blood pressure check

## 2021-06-19 NOTE — Patient Instructions (Signed)
We start a medication called amlodipine for your blood pressure.  Would like to see back in about 1 week for blood pressure recheck.  For your shoulder pain we are starting an anti-inflammatory to take once per day called meloxicam.  Take this for the next 5 days.  Do not drink alcohol with that or take other NSAIDs such as ibuprofen or naproxen.  If your shoulder pain continues more than the next 2 weeks or is not improving we can return to discuss possible physical therapy.

## 2021-06-19 NOTE — Progress Notes (Signed)
° ° °  SUBJECTIVE:   CHIEF COMPLAINT / HPI:   Hypertension: Presented for follow-up after noting elevated blood pressure at previous office visit.  On 12/5 had blood pressure of 140/91.  Current antihypertensive medications include-none.  Left shoulder pain: Started a week or two ago. Initially thought she may have slept on it wrong. She notes some pain in the anterior when moving her arm around. No trauma or change in daily routine. She has tried some tylenol which didn't seem help.   PERTINENT  PMH / PSH: History of elevated blood pressures  OBJECTIVE:   BP (!) 135/95    Pulse 90    Ht 5\' 4"  (1.626 m)    Wt 208 lb 12.8 oz (94.7 kg)    LMP 05/31/2021    SpO2 100%    BMI 35.84 kg/m    General: NAD, pleasant, able to participate in exam Respiratory: No respiratory distress TO:[id number], left: No evidence of bony deformity, asymmetry, or muscle atrophy; No tenderness over long head of biceps (bicipital groove). No TTP at [facility] joint.  Slightly limited active and passive range of motion particularly with abduction, Thumb to T12 without significant tenderness. Strength 5/5 throughout. No abnormal scapular function observed. Sensation intact. Peripheral pulses intact.  Negative Yergason's, negative empty can testing though she does have a mild amount of discomfort with this, no pain with internal or external rotation. Psych: Normal affect and mood   ASSESSMENT/PLAN:   Essential hypertension Blood pressure today of 135/95.  Has had multiple previous elevated blood pressures during office visits.  We will initiate amlodipine 10mg  daily.  Follow-up in 1 week for blood pressure check   Left shoulder pain: No trauma or injury.  Physical exam overall reassuring but it is suggestive of possibly mild rotator cuff etiology.  She has some pain with empty can testing but the test is overall negative.  Negative for pain with internal and external rotation.  She has no contraindications to NSAIDs other than  mild elevated blood pressure.  We will initiate meloxicam for 5 days.  Discussed maintaining good mobility and using ice or heat.  If her symptoms do not improve in the next 1 to 2 weeks can discuss physical therapy  Lurline Del, [place]

## 2021-12-10 ENCOUNTER — Ambulatory Visit (INDEPENDENT_AMBULATORY_CARE_PROVIDER_SITE_OTHER): Payer: BLUE CROSS/BLUE SHIELD

## 2021-12-10 ENCOUNTER — Other Ambulatory Visit (HOSPITAL_COMMUNITY)
Admission: RE | Admit: 2021-12-10 | Discharge: 2021-12-10 | Disposition: A | Discharge status: Home / Self Care | Payer: BLUE CROSS/BLUE SHIELD | Source: Ambulatory Visit | Attending: Obstetrics & Gynecology | Admitting: Obstetrics & Gynecology

## 2021-12-10 DIAGNOSIS — N3000 Acute cystitis without hematuria: Secondary | ICD-10-CM

## 2021-12-10 DIAGNOSIS — R350 Frequency of micturition: Secondary | ICD-10-CM | POA: Diagnosis not present

## 2021-12-10 DIAGNOSIS — N898 Other specified noninflammatory disorders of vagina: Secondary | ICD-10-CM

## 2021-12-10 DIAGNOSIS — N760 Acute vaginitis: Secondary | ICD-10-CM | POA: Insufficient documentation

## 2021-12-10 DIAGNOSIS — B9689 Other specified bacterial agents as the cause of diseases classified elsewhere: Secondary | ICD-10-CM

## 2021-12-10 LAB — POCT URINALYSIS DIPSTICK
Bilirubin, UA: NEGATIVE
Glucose, UA: NEGATIVE
Ketones, UA: NEGATIVE
Leukocytes, UA: NEGATIVE
Nitrite, UA: NEGATIVE
Protein, UA: NEGATIVE
Spec Grav, UA: 1.015 (ref 1.010–1.025)
Urobilinogen, UA: 0.2 E.U./dL
pH, UA: 6 (ref 5.0–8.0)

## 2021-12-10 NOTE — Progress Notes (Cosign Needed Addendum)
..  SUBJECTIVE:  41 y.o. female complains of white vaginal discharge for 1 week(s). Reports abnormal vaginal bleeding and would like to restart medication, no reports of significant pelvic pain or fever. Reports odor with urination and frequency. Denies history of known exposure to STD.  No LMP recorded. (Menstrual status: Irregular Periods).  OBJECTIVE:  She appears well, afebrile. Urine dipstick: negative for all components.  ASSESSMENT:  Vaginal Discharge  Vaginal Odor   PLAN:  GC, chlamydia, trichomonas, BVAG, CVAG probe sent to lab. Treatment: To be determined once lab results are received ROV prn if symptoms persist or worsen. Urine culture sent Advised pt to schedule appt with provider to discuss AUB and medication regimen, pt agreed.

## 2021-12-11 LAB — CERVICOVAGINAL ANCILLARY ONLY
Bacterial Vaginitis (gardnerella): POSITIVE — AB
Candida Glabrata: NEGATIVE
Candida Vaginitis: NEGATIVE
Chlamydia: NEGATIVE
Comment: NEGATIVE
Comment: NEGATIVE
Comment: NEGATIVE
Comment: NEGATIVE
Comment: NEGATIVE
Comment: NORMAL
Neisseria Gonorrhea: NEGATIVE
Trichomonas: NEGATIVE

## 2021-12-12 ENCOUNTER — Encounter: Payer: Self-pay | Admitting: Student

## 2021-12-12 ENCOUNTER — Ambulatory Visit (INDEPENDENT_AMBULATORY_CARE_PROVIDER_SITE_OTHER): Payer: 59 | Admitting: Student

## 2021-12-12 VITALS — BP 148/74 | Ht 64.0 in | Wt 208.0 lb

## 2021-12-12 DIAGNOSIS — R42 Dizziness and giddiness: Secondary | ICD-10-CM

## 2021-12-12 DIAGNOSIS — I10 Essential (primary) hypertension: Secondary | ICD-10-CM

## 2021-12-12 DIAGNOSIS — I951 Orthostatic hypotension: Secondary | ICD-10-CM | POA: Insufficient documentation

## 2021-12-12 MED ORDER — METRONIDAZOLE 500 MG PO TABS: 500.0000 mg | ORAL_TABLET | Freq: Two times a day (BID) | ORAL | 0 refills | Status: AC

## 2021-12-12 NOTE — Progress Notes (Signed)
    SUBJECTIVE:   CHIEF COMPLAINT / HPI:   HTN Pt hasn't taken Amlodipine 10 mg in about a week as she hasn't picked up her prescription.   Light headedness Pt complains of episodes of light headedness which occur a few times/day for past couple months. She states they only last for a few seconds. They usually occur when she is standing but has occurred with sitting before. When they occur she just waits a a few seconds for them to pass on their own, doesn't need to sit or lie down. She admits to headaches every couple days. She takes ibuprofen which makes them go away. She denies and chest pain or SOB. She states she isn't a big water drinker but is trying to drink more water.   Difficultly sleeping Hard time falling asleep at night, most nights can't fall alseep until 2 in the morning but is able to sleep in until 10 am. Doesn't feel anxious.   Bump on lip Been there for past couple months, does not itch, burn, hurt in any way. She just doesn't like the way it looks.   PERTINENT  PMH / PSH: HTN  OBJECTIVE:   BP (!) 148/74   Ht 5\' 4"  (1.626 m)   Wt 208 lb (94.3 kg)   LMP 11/16/2021 (Approximate)   BMI 35.70 kg/m    General: NAD, pleasant, able to participate in exam Cardiac: RRR, no murmurs. Respiratory: CTAB, normal effort, No wheezes, rales or rhonchi Skin: warm and dry, no rashes noted. Small skin colored bump on lip. No sore or vesicle. See photo in media tab Neuro: alert, cranial nerves II through XII intact, sensation intact, finger-nose-finger test normal Psych: Normal affect and mood  ASSESSMENT/PLAN:   Essential hypertension Due to lightheadedness, patient advised to stop taking amlodipine and return in 1 to 2 weeks for a blood pressure check.  Depending on blood pressure and orthostatic vitals, can consider starting a different medication if appropriate. -BMP today as she has HTN and has not had BMP in over a year  Orthostatic hypotension Orthostatic vitals were  positive today with systolic going from 143 to 118 from lying to standing. This is likely the cause of her light headedness. The symptoms began after patient started on amlodipine. Although she has been off of amlodipine for about a week, it has a long half long and could still have effects.  We will trial patient off of amlodipine for another week or 2 and have patient return to repeat orthostatic vitals.  Patient advised to monitor for symptoms of lightheadedness in the meantime. Will also check a CBC to ensure anemia is not causing light headedness.    Bump on lip Pt advised to continue to monitor.   Difficulty falling asleep Pt given information on sleep hygiene and advised to make herself wake up earlier if she wants to fall asleep before 2 am. Currently as she is sleeping until 10-11, she is getting a full 8 hours of sleep which is probably why she cannot fall asleep until 2 am.   Dr. 11/18/2021, DO [facility]

## 2021-12-12 NOTE — Assessment & Plan Note (Addendum)
Orthostatic vitals were positive today with systolic going from 143 to 118 from lying to standing. This is likely the cause of her light headedness. The symptoms began after patient started on amlodipine. Although she has been off of amlodipine for about a week, it has a long half long and could still have effects.  We will trial patient off of amlodipine for another week or 2 and have patient return to repeat orthostatic vitals.  Patient advised to monitor for symptoms of lightheadedness in the meantime. Will also check a CBC to ensure anemia is not causing light headedness.

## 2021-12-12 NOTE — Addendum Note (Signed)
Addended by: Jaynie Collins A on: 12/12/2021 09:17 AM   Modules accepted: Orders

## 2021-12-12 NOTE — Assessment & Plan Note (Addendum)
Due to lightheadedness, patient advised to stop taking amlodipine and return in 1 to 2 weeks for a blood pressure check.  Depending on blood pressure and orthostatic vitals, can consider starting a different medication if appropriate. -BMP today as she has HTN and has not had BMP in over a year

## 2021-12-12 NOTE — Patient Instructions (Addendum)
It was great to see you! Thank you for allowing me to participate in your care!   Our plans for today:  - I recommend you practice good sleep hygiene to help you sleep better.  This includes no screens at least an hour before bedtime, no meals or exercise at least 2 hours prior to bedtime, no TV in the bedroom, do something relaxing an hour prior to when you want to fall asleep such as reading, talking with a friend, coloring, listening to music, journaling, etc. -For your lightheadedness, Please stop taking your amlodipine for 2 weeks. See if this helps with the symptoms. Please return in 2 weeks for a follow up visit. We may start a new blood pressure medication at this time if it is appropriate    We are checking some labs today, I will call you if they are abnormal will send you a MyChart message or a letter if they are normal.  If you do not hear about your labs in the next 2 weeks please let us know.  Take care and seek immediate care sooner if you develop any concerns.   Dr. Erick Alley, DO [facility]

## 2021-12-13 LAB — CBC
Hematocrit: 40.3 % (ref 34.0–46.6)
Hemoglobin: 13.4 g/dL (ref 11.1–15.9)
MCH: 28.1 pg (ref 26.6–33.0)
MCHC: 33.3 g/dL (ref 31.5–35.7)
MCV: 85 fL (ref 79–97)
Platelets: 287 10*3/uL (ref 150–450)
RBC: 4.77 x10E6/uL (ref 3.77–5.28)
RDW: 12.9 % (ref 11.7–15.4)
WBC: 6.9 10*3/uL (ref 3.4–10.8)

## 2021-12-13 LAB — BASIC METABOLIC PANEL
BUN/Creatinine Ratio: 17 (ref 9–23)
BUN: 12 mg/dL (ref 6–24)
CO2: 21 mmol/L (ref 20–29)
Calcium: 9.2 mg/dL (ref 8.7–10.2)
Chloride: 104 mmol/L (ref 96–106)
Creatinine, Ser: 0.7 mg/dL (ref 0.57–1.00)
Glucose: 94 mg/dL (ref 70–99)
Potassium: 4.1 mmol/L (ref 3.5–5.2)
Sodium: 138 mmol/L (ref 134–144)
eGFR: 110 mL/min/{1.73_m2} (ref >59)

## 2021-12-13 LAB — URINE CULTURE

## 2021-12-13 MED ORDER — CEFADROXIL 500 MG PO CAPS: 500.0000 mg | ORAL_CAPSULE | Freq: Two times a day (BID) | ORAL | 0 refills | Status: DC

## 2021-12-13 NOTE — Addendum Note (Signed)
Addended by: Jaynie Collins A on: 12/13/2021 04:59 PM   Modules accepted: Orders

## 2022-05-14 ENCOUNTER — Ambulatory Visit: Payer: BLUE CROSS/BLUE SHIELD | Admitting: Student

## 2022-05-14 NOTE — Progress Notes (Unsigned)
    SUBJECTIVE:   CHIEF COMPLAINT / HPI:   HTN At last visit in July, pt was taken off amlodipine d/t light headedness and positive orthostatic vitals.   Health maintenance Due for mammogram.  PERTINENT  PMH / PSH: ***  OBJECTIVE:   There were no vitals taken for this visit. ***  General: NAD, pleasant, able to participate in exam Cardiac: RRR, no murmurs. Respiratory: CTAB, normal effort, No wheezes, rales or rhonchi Abdomen: Bowel sounds present, nontender, nondistended, no hepatosplenomegaly. Extremities: no edema or cyanosis. Skin: warm and dry, no rashes noted Neuro: alert, no obvious focal deficits Psych: Normal affect and mood  ASSESSMENT/PLAN:   No problem-specific Assessment & Plan notes found for this encounter.     Dr. Erick Alley, DO [facility]    {    This will disappear when note is signed, click to select method of visit    :1}

## 2022-05-16 ENCOUNTER — Ambulatory Visit (INDEPENDENT_AMBULATORY_CARE_PROVIDER_SITE_OTHER): Payer: BLUE CROSS/BLUE SHIELD | Admitting: Student

## 2022-05-16 VITALS — BP 120/72 | HR 95 | Temp 99.1°F | Ht 64.0 in | Wt 209.2 lb

## 2022-05-16 DIAGNOSIS — R051 Acute cough: Secondary | ICD-10-CM | POA: Diagnosis not present

## 2022-05-16 DIAGNOSIS — I10 Essential (primary) hypertension: Secondary | ICD-10-CM | POA: Diagnosis not present

## 2022-05-16 DIAGNOSIS — Z1231 Encounter for screening mammogram for malignant neoplasm of breast: Secondary | ICD-10-CM | POA: Diagnosis not present

## 2022-05-16 DIAGNOSIS — J101 Influenza due to other identified influenza virus with other respiratory manifestations: Secondary | ICD-10-CM

## 2022-05-16 LAB — POCT INFLUENZA A/B
Influenza A, POC: POSITIVE — AB
Influenza B, POC: NEGATIVE

## 2022-05-16 NOTE — Assessment & Plan Note (Signed)
Patient is positive for influenza A.  Handout on flu with supportive care and return precautions given.  Patient advised to use Tylenol and/or ibuprofen over-the-counter for symptom relief.  Work note given.

## 2022-05-16 NOTE — Patient Instructions (Signed)
It was great to see you! Thank you for allowing me to participate in your care!  Our plans for today:  -Sorry you are sick!  Please see the attached information about the flu, supportive care, when to return if needed.  Take care and seek immediate care sooner if you develop any concerns.   Dr. Erick Alley, DO [facility]

## 2022-05-16 NOTE — Assessment & Plan Note (Signed)
Blood pressure well-controlled off of medication.

## 2022-05-19 IMAGING — MG MM DIGITAL SCREENING BILAT W/ TOMO AND CAD
8 series · 8 of 24 positions shown · non-contrast
Comparison: None.

CLINICAL DATA: Screening.

EXAM:
DIGITAL SCREENING BILATERAL MAMMOGRAM WITH TOMOSYNTHESIS AND CAD
TECHNIQUE: Bilateral screening digital craniocaudal and mediolateral oblique
mammograms were obtained. Bilateral screening digital breast
tomosynthesis was performed. The images were evaluated with
computer-aided detection.

[R MLO synth-2D]
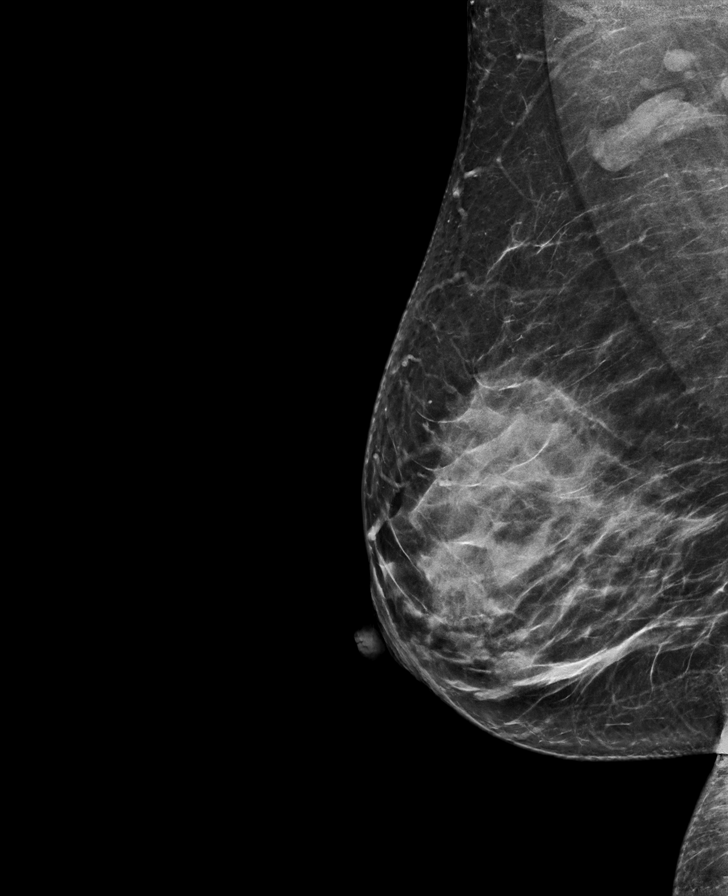

[L MLO synth-2D]
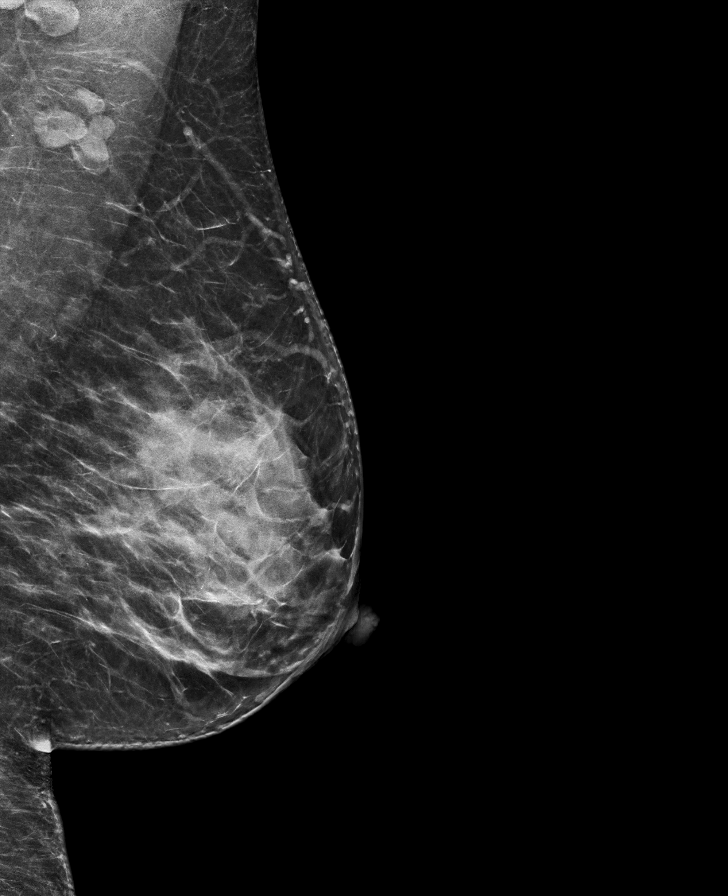

[R CC synth-2D]
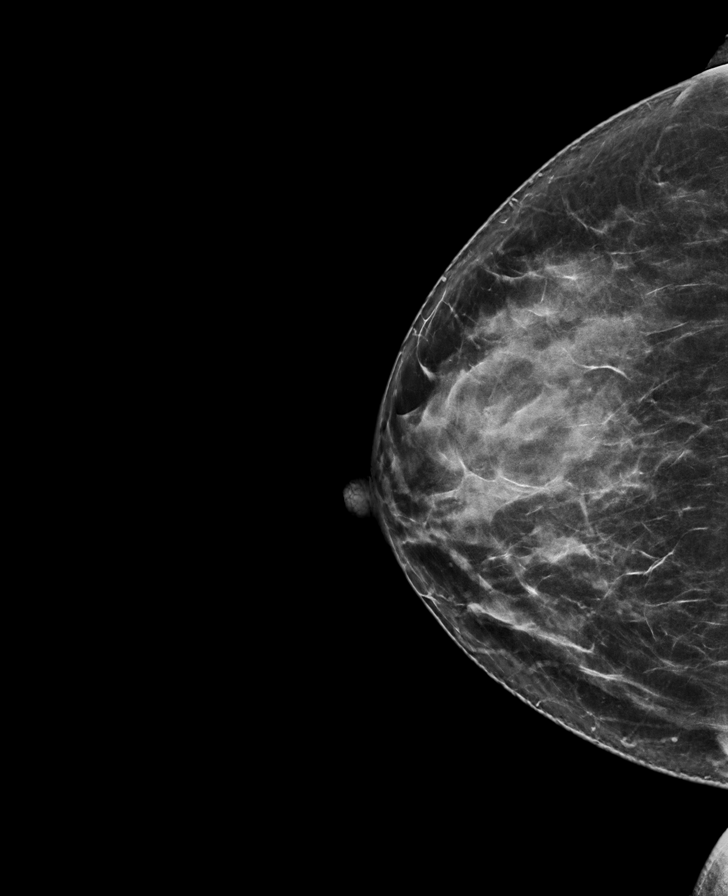

[L CC synth-2D]
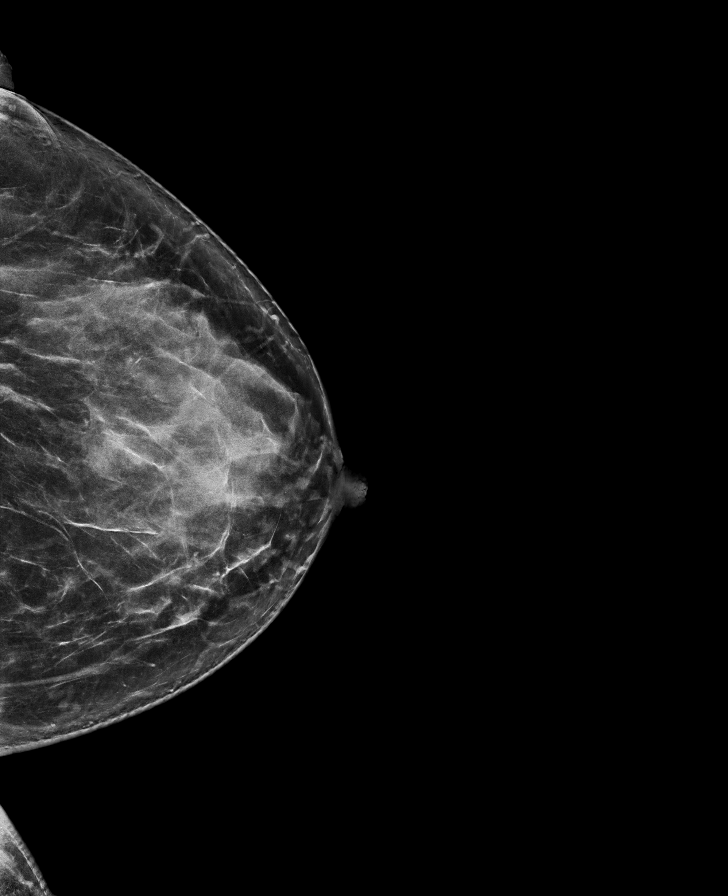

[L CC tomo · tomo slice 39/76.0]
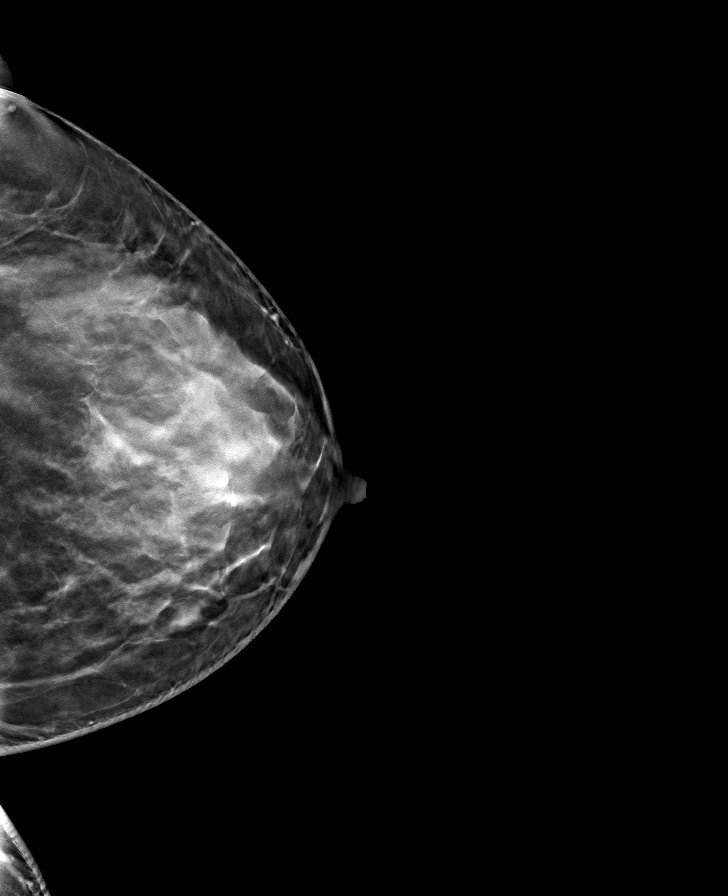

[L MLO tomo · tomo slice 39/77.0]
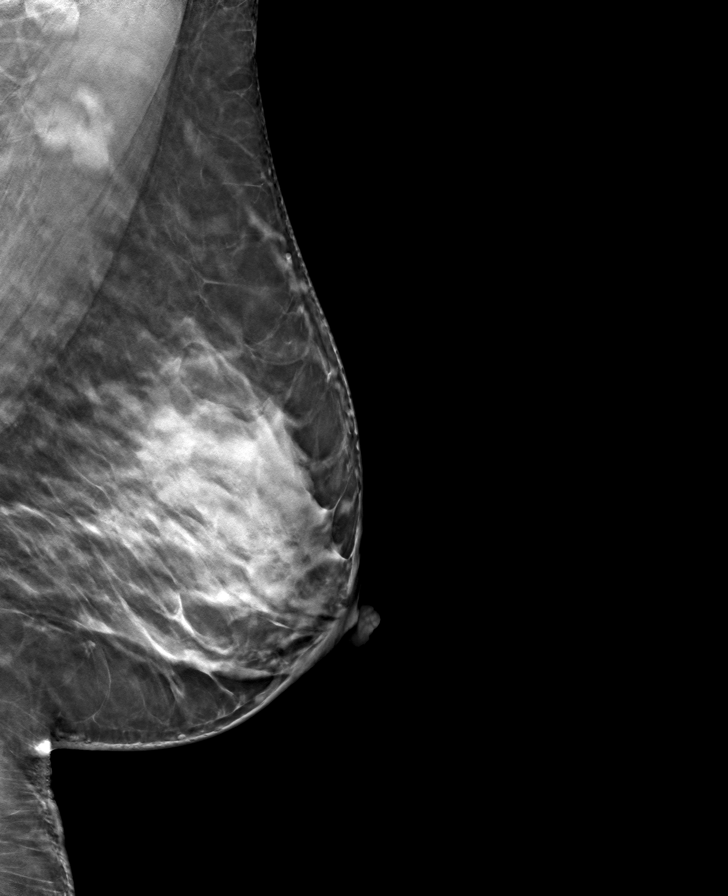

[R CC tomo · tomo slice 39/76.0]
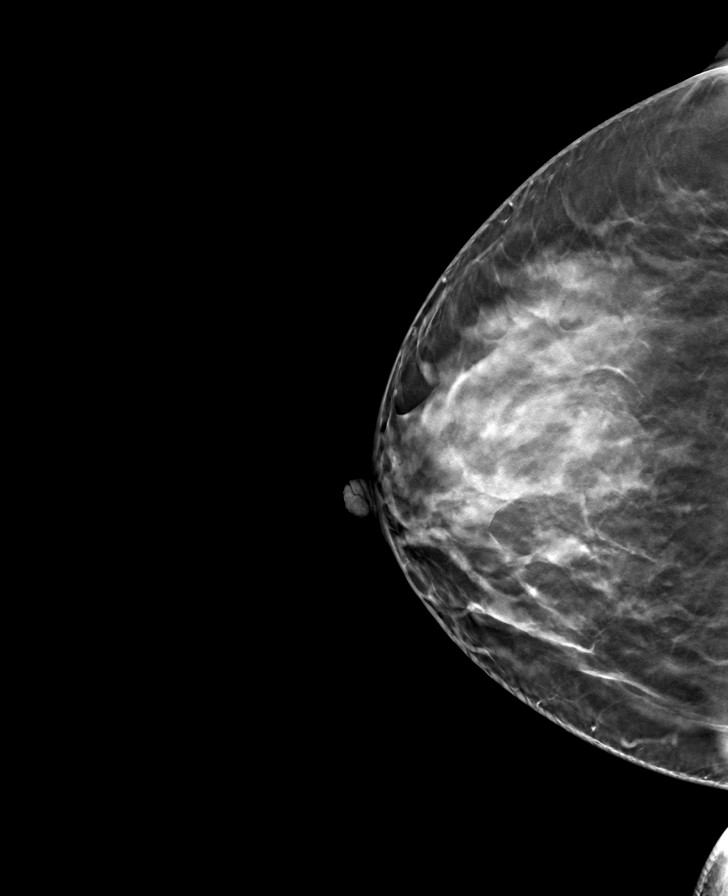

[R MLO tomo · tomo slice 39/77.0]
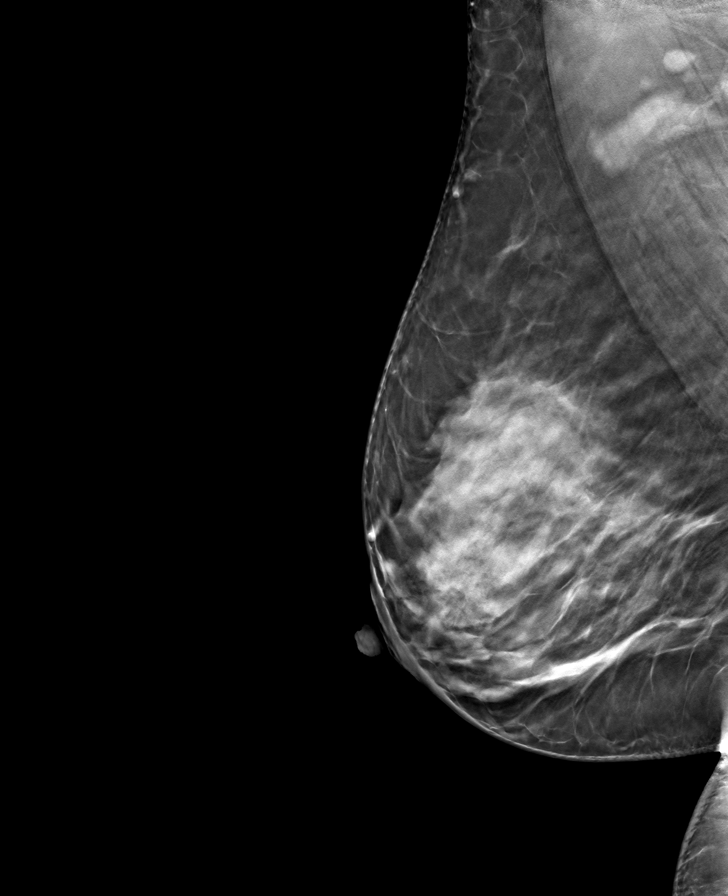

[8 of 24 positions shown; findings below may reference images not displayed]

ACR Breast Density Category c: The breast tissue is heterogeneously
dense, which may obscure small masses
FINDINGS: There are no findings suspicious for malignancy.
IMPRESSION: No mammographic evidence of malignancy. A result letter of this
screening mammogram will be mailed directly to the patient.

RECOMMENDATION:
Screening mammogram in one year. (Code:C8-T-HNK)

BI-RADS CATEGORY  1: Negative.

## 2022-05-24 ENCOUNTER — Telehealth: Payer: Self-pay

## 2022-05-24 NOTE — Telephone Encounter (Signed)
Patient calls nurse line requesting something for her cough.   She reports she was diagnosed with the flu on 12/21. She reports she went back to work yesterday, however reports she had to call out today. She reports she is coughing so much at night she is not getting any rest.   Patient is requesting a note for work excusing her from today and return 05/29/2022.  She denies any worsening symptoms.   Will forward to PCP.

## 2022-05-27 ENCOUNTER — Encounter: Payer: Self-pay | Admitting: Student

## 2022-05-29 NOTE — Telephone Encounter (Signed)
Patient returns call to nurse line. She states that she is unable to access [employer]. Provided update per message.   Patient is asking for virtual appointment. Received okay from Camillia Herter. Advised patient of limitations of virtual for this concern. Patient states that she is going to see if she can obtain transportation and call back to office to schedule.   Talbot Grumbling, RN

## 2022-05-30 ENCOUNTER — Ambulatory Visit (INDEPENDENT_AMBULATORY_CARE_PROVIDER_SITE_OTHER): Payer: 59 | Admitting: Student

## 2022-05-30 ENCOUNTER — Ambulatory Visit (HOSPITAL_COMMUNITY)
Admission: RE | Admit: 2022-05-30 | Discharge: 2022-05-30 | Disposition: A | Discharge status: Home / Self Care | Payer: 59 | Source: Ambulatory Visit | Attending: Family Medicine | Admitting: Family Medicine

## 2022-05-30 ENCOUNTER — Encounter: Payer: Self-pay | Admitting: Student

## 2022-05-30 VITALS — BP 136/78 | HR 88 | Temp 98.7°F | Wt 208.4 lb

## 2022-05-30 DIAGNOSIS — R051 Acute cough: Secondary | ICD-10-CM | POA: Diagnosis present

## 2022-05-30 DIAGNOSIS — J101 Influenza due to other identified influenza virus with other respiratory manifestations: Secondary | ICD-10-CM

## 2022-05-30 MED ORDER — ONDANSETRON HCL 4 MG PO TABS: 4.0000 mg | ORAL_TABLET | Freq: Three times a day (TID) | ORAL | 0 refills | Status: DC | PRN

## 2022-05-30 NOTE — Progress Notes (Signed)
    SUBJECTIVE:   CHIEF COMPLAINT / HPI:   [name] is a 42 year old female here for ongoing symptoms from influenza A. Having diarrhea. Still having nausea.  Feels short of breath as well. Most bothersome symptom is cough- coughing up phlegm that is yellow-tinged. Has been taking Dayuil and Nyquil.  Not eating much. Still having body aches.  PERTINENT  PMH / PSH: Reviewed  OBJECTIVE:   BP 136/78   Pulse 88   Temp 98.7 F (37.1 C) (Oral)   Wt 208 lb 6.4 oz (94.5 kg)   LMP 05/30/2022   SpO2 99%   BMI 35.77 kg/m   General: Tired appearing, but nontoxic CV: Regular rate and rhythm Respiratory: Normal work of breathing on room air.  Good air movement.  Some scant bibasilar crackles. Extremities: Warm and well-perfused  ASSESSMENT/PLAN:   Influenza A Ongoing symptoms from influenza A.  She is tired appearing, but well-hydrated and has hemodynamically stable vital signs.  She is stable on room air. Discussed expected course of influenza infection that may take up to 2 weeks to start to see improvement in symptoms.  Discussed red flag signs and symptoms. Chest x-ray did show an early infiltrate.  Could be due to influenza A versus developing pneumonia.  Will go ahead and treat for community-acquired pneumonia with azithromycin x 5 days. Return precautions provided to patient. Also prescribed Zofran for nausea to be taken every 8 hours as needed.     Orvis Brill, [place]

## 2022-05-30 NOTE — Patient Instructions (Signed)
It was great seeing you today.  Go to Entrance A at [facility] to get your chest x ray done. Someone will guide you to radiology.  Take Zofran every 8 hours as needed for nausea. Continue to drink plenty of fluids- more than you usually would (gatorade zero, liquid IV, water).  Take Tylenol every 4-5 hours or Advil every 6-8 hours as needed.   If you have any questions or concerns, please feel free to call the clinic.   Have a wonderful day,  Dr. Orvis Brill [facility] [phone]

## 2022-05-31 ENCOUNTER — Telehealth: Payer: Self-pay | Admitting: Student

## 2022-05-31 ENCOUNTER — Other Ambulatory Visit: Payer: Self-pay | Admitting: Student

## 2022-05-31 MED ORDER — AZITHROMYCIN 250 MG PO TABS: ORAL_TABLET | ORAL | 0 refills | Status: DC

## 2022-05-31 NOTE — Telephone Encounter (Signed)
Attempted to call patient with results regarding CXR showing early infiltrate- could be due to influenza infection or early developing pneumonia. Voicemail box full- could not leave voicemail.  Plan to treat for community acquired pneumonia with antibiotic course with  Azithromycin. Order pended.

## 2022-05-31 NOTE — Telephone Encounter (Signed)
Patient returns call to nurse line.   Patient advised of results and need to start antibiotic.   Patient advised to go and pick up prescription. Directions given.   Patient to return to clinic for worsening symptoms.

## 2022-06-02 NOTE — Assessment & Plan Note (Signed)
Ongoing symptoms from influenza A.  She is tired appearing, but well-hydrated and has hemodynamically stable vital signs.  She is stable on room air. Discussed expected course of influenza infection that may take up to 2 weeks to start to see improvement in symptoms.  Discussed red flag signs and symptoms. Chest x-ray did show an early infiltrate.  Could be due to influenza A versus developing pneumonia.  Will go ahead and treat for community-acquired pneumonia with azithromycin x 5 days. Return precautions provided to patient. Also prescribed Zofran for nausea to be taken every 8 hours as needed.

## 2022-06-03 ENCOUNTER — Telehealth: Payer: Self-pay | Admitting: Student

## 2022-06-03 NOTE — Telephone Encounter (Signed)
Called patient to f/u. Has one more day of azithromycin. Feeling okay. No SOB or fevers. Requesting work note extending work leave, provided via Pharmacist, community. Return precautinos discussed.

## 2022-06-03 NOTE — Telephone Encounter (Signed)
Patient returns call to nurse line. She is asking if it is safe for her to return to work tomorrow.   She states that symptoms have improved, however, she continues to have congestion and cough.   She denies current chest pain or difficulty breathing.   Please advise when patient should return to work.   Talbot Grumbling, RN

## 2022-06-03 NOTE — Telephone Encounter (Signed)
Patient calls nurse checking status of work recommendations.   She reports she has one more pill left of her antibiotic. She reports she is feeling better, however she reports continued chest congestion and pressure.   Patient is requesting to return to work next week on 06/10/2022.  Patient is unsure if she needs to FU with another chest x ray.   Will forward to provider who saw patient.

## 2022-07-15 ENCOUNTER — Ambulatory Visit: Payer: BLUE CROSS/BLUE SHIELD

## 2022-09-06 ENCOUNTER — Encounter: Payer: Self-pay | Admitting: Student

## 2022-09-06 ENCOUNTER — Ambulatory Visit (INDEPENDENT_AMBULATORY_CARE_PROVIDER_SITE_OTHER): Payer: 59 | Admitting: Student

## 2022-09-06 VITALS — BP 120/78 | HR 79 | Ht 64.0 in | Wt 204.0 lb

## 2022-09-06 DIAGNOSIS — R399 Unspecified symptoms and signs involving the genitourinary system: Secondary | ICD-10-CM | POA: Diagnosis not present

## 2022-09-06 LAB — POCT UA - MICROSCOPIC ONLY: WBC, Ur, HPF, POC: NONE SEEN (ref 0–5)

## 2022-09-06 LAB — POCT URINALYSIS DIP (MANUAL ENTRY)
Bilirubin, UA: NEGATIVE
Glucose, UA: NEGATIVE mg/dL
Ketones, POC UA: NEGATIVE mg/dL
Leukocytes, UA: NEGATIVE
Nitrite, UA: NEGATIVE
Protein Ur, POC: NEGATIVE mg/dL
Spec Grav, UA: 1.025 (ref 1.010–1.025)
Urobilinogen, UA: 0.2 E.U./dL
pH, UA: 6 (ref 5.0–8.0)

## 2022-09-06 NOTE — Progress Notes (Addendum)
  SUBJECTIVE:   CHIEF COMPLAINT / HPI:   UTI symptoms Having urges to use the bathroom, nothing comes out, and burning and tingling. Everything started about a week ago, but has been less of an issue for the last couple of days.   PERTINENT  PMH / PSH: HTN  Patient Care Team: Erick Alley, DO as PCP - General (Family Medicine) OBJECTIVE:  BP 120/78   Pulse 79   Ht 5\' 4"  (1.626 m)   Wt 204 lb (92.5 kg)   LMP 08/19/2022 (Approximate)   SpO2 100%   BMI 35.02 kg/m  Physical Exam Constitutional:      General: She is not in acute distress.    Appearance: Normal appearance. She is not ill-appearing.  Cardiovascular:     Rate and Rhythm: Normal rate and regular rhythm.     Pulses: Normal pulses.     Heart sounds: Normal heart sounds. No murmur heard.    No friction rub. No gallop.  Pulmonary:     Effort: Pulmonary effort is normal. No respiratory distress.     Breath sounds: Normal breath sounds. No stridor. No wheezing, rhonchi or rales.  Abdominal:     General: Bowel sounds are normal. There is no distension.     Palpations: Abdomen is soft. There is no mass.     Tenderness: There is no abdominal tenderness. There is no right CVA tenderness, left CVA tenderness or guarding.  Neurological:     Mental Status: She is alert.      ASSESSMENT/PLAN:  UTI symptoms Assessment & Plan: Patient notes symptoms started about a week ago with burning/tingling urination and having to pee often, but not producing any urine when she goes to bathroom. She's had a UTI before and this is just like last time. She notes that symptoms have been better for last couple days. UA was negative/normal and patient w/o signs and symptoms of active infection. Patient most likely cleared infection on her own, as she noted some resolution over the last couple of days.  -F/u prn  Orders: -     Urine Culture -     POCT urinalysis dipstick -     POCT UA - Microscopic Only   No follow-ups on file. Bess Kinds, MD 09/06/2022, 2:45 PM PGY-2, [facility]

## 2022-09-06 NOTE — Patient Instructions (Signed)
It was great to see you! Thank you for allowing me to participate in your care!  We saw you today for concern of a UTI. You do not have a UTI, it looks like you may have cleared it on your own.    Take care and seek immediate care sooner if you develop any concerns.   Dr. Bess Kinds, MD [facility]

## 2022-09-06 NOTE — Assessment & Plan Note (Signed)
Patient notes symptoms started about a week ago with burning/tingling urination and having to pee often, but not producing any urine when she goes to bathroom. She's had a UTI before and this is just like last time. She notes that symptoms have been better for last couple days. UA was negative/normal and patient w/o signs and symptoms of active infection. Patient most likely cleared infection on her own, as she noted some resolution over the last couple of days.  -F/u prn

## 2022-09-10 LAB — URINE CULTURE

## 2022-11-04 ENCOUNTER — Ambulatory Visit: Payer: 59 | Admitting: Family Medicine

## 2022-11-08 ENCOUNTER — Ambulatory Visit: Payer: 59

## 2022-11-14 ENCOUNTER — Ambulatory Visit: Payer: 59

## 2022-12-12 ENCOUNTER — Encounter: Payer: Self-pay | Admitting: Student

## 2023-02-20 ENCOUNTER — Ambulatory Visit: Payer: 59 | Admitting: Emergency Medicine

## 2023-02-20 ENCOUNTER — Other Ambulatory Visit (HOSPITAL_COMMUNITY)
Admission: RE | Admit: 2023-02-20 | Discharge: 2023-02-20 | Disposition: A | Discharge status: Home / Self Care | Payer: 59 | Source: Ambulatory Visit | Attending: Obstetrics & Gynecology | Admitting: Obstetrics & Gynecology

## 2023-02-20 VITALS — BP 121/79 | HR 78 | Ht 64.0 in | Wt 201.4 lb

## 2023-02-20 DIAGNOSIS — Z113 Encounter for screening for infections with a predominantly sexual mode of transmission: Secondary | ICD-10-CM | POA: Insufficient documentation

## 2023-02-20 DIAGNOSIS — R300 Dysuria: Secondary | ICD-10-CM

## 2023-02-20 NOTE — Progress Notes (Signed)
SUBJECTIVE:  42 y.o. female complains of dysuria month. Denies abnormal vaginal bleeding or significant pelvic pain or fever. Denies history of known exposure to STD.  No LMP recorded. (Menstrual status: Irregular Periods).  OBJECTIVE:  She appears well, afebrile. Urine dipstick: positive for nitrates and positive for leukocytes.  ASSESSMENT:  Vaginal Discharge  Vaginal Odor   PLAN:  GC, chlamydia, trichomonas, BVAG, CVAG probe sent to lab. Treatment: To be determined once lab results are received ROV prn if symptoms persist or worsen.

## 2023-02-21 ENCOUNTER — Ambulatory Visit: Payer: 59

## 2023-02-21 LAB — CERVICOVAGINAL ANCILLARY ONLY
Bacterial Vaginitis (gardnerella): POSITIVE — AB
Candida Glabrata: NEGATIVE
Candida Vaginitis: NEGATIVE
Chlamydia: NEGATIVE
Comment: NEGATIVE
Comment: NEGATIVE
Comment: NEGATIVE
Comment: NEGATIVE
Comment: NEGATIVE
Comment: NORMAL
Neisseria Gonorrhea: NEGATIVE
Trichomonas: NEGATIVE

## 2023-02-24 LAB — URINE CULTURE

## 2023-02-28 ENCOUNTER — Other Ambulatory Visit: Payer: Self-pay

## 2023-02-28 DIAGNOSIS — B9689 Other specified bacterial agents as the cause of diseases classified elsewhere: Secondary | ICD-10-CM

## 2023-02-28 MED ORDER — METRONIDAZOLE 0.75 % VA GEL: 1.0000 | Freq: Every day | VAGINAL | 1 refills | Status: DC

## 2023-03-26 ENCOUNTER — Other Ambulatory Visit: Payer: Self-pay

## 2023-03-26 DIAGNOSIS — B9689 Other specified bacterial agents as the cause of diseases classified elsewhere: Secondary | ICD-10-CM

## 2023-03-26 MED ORDER — TINIDAZOLE 500 MG PO TABS: 2.0000 g | ORAL_TABLET | Freq: Every day | ORAL | 0 refills | Status: DC

## 2023-05-01 ENCOUNTER — Ambulatory Visit: Payer: 59 | Admitting: Podiatry

## 2023-05-02 ENCOUNTER — Ambulatory Visit (INDEPENDENT_AMBULATORY_CARE_PROVIDER_SITE_OTHER): Payer: 59

## 2023-05-02 ENCOUNTER — Ambulatory Visit (INDEPENDENT_AMBULATORY_CARE_PROVIDER_SITE_OTHER): Payer: 59 | Admitting: Podiatry

## 2023-05-02 ENCOUNTER — Encounter: Payer: Self-pay | Admitting: Podiatry

## 2023-05-02 DIAGNOSIS — M778 Other enthesopathies, not elsewhere classified: Secondary | ICD-10-CM | POA: Diagnosis not present

## 2023-05-02 DIAGNOSIS — L603 Nail dystrophy: Secondary | ICD-10-CM | POA: Diagnosis not present

## 2023-05-02 NOTE — Patient Instructions (Signed)

## 2023-05-02 NOTE — Progress Notes (Unsigned)
Subjective:   Patient ID: [name], female   DOB: 42 y.o.   MRN: [id number]   HPI Chief Complaint  Patient presents with   Ingrown Toenail    Patiet states she believes she has 2 ingrown toe nails on bilateral halluxs,it has been like this for a while patient states , no medication for pain . Patient also states that the side of each foot on the side of bilateral halluxs hurt when she wears shoes    42 year old female presents the office with above concerns.  She says she is hide the pain to both of her big toenails.  No swelling redness or drainage from the areas.  Hurts with pressure, shoes.  She also has secondary concerns of bunions to both of her feet that are causing pain.  She tried to modification, offloading.  This also hurts with pressure.   Review of Systems  All other systems reviewed and are negative.  Past Medical History:  Diagnosis Date   Acute appendicitis 12/06/2011   HTN (hypertension)     Past Surgical History:  Procedure Laterality Date   CESAREAN SECTION  2007   DIAGNOSTIC LAPAROSCOPY WITH REMOVAL OF ECTOPIC PREGNANCY N/A 05/18/2016   Procedure: DIAGNOSTIC LAPAROSCOPY WITH LEFT SALPINGECTOMY;  Surgeon: Catalina Antigua, MD;  Location: [facility];  Service: Gynecology;  Laterality: N/A;   DILATION AND CURETTAGE OF UTERUS     LAPAROSCOPIC APPENDECTOMY  12/05/2011   Procedure: APPENDECTOMY LAPAROSCOPIC;  Surgeon: Ardeth Sportsman, MD;  Location: [facility];  Service: General;  Laterality: N/A;   TUBAL LIGATION       Current Outpatient Medications:    azithromycin (ZITHROMAX Z-PAK) 250 MG tablet, Take 2 tablets on day 1 followed by 1 tablet on days 2 to 5. (Patient not taking: Reported on 05/02/2023), Disp: 6 each, Rfl: 0   metroNIDAZOLE (METROGEL) 0.75 % vaginal gel, Place 1 Applicatorful vaginally at bedtime. Apply one applicatorful to vagina at bedtime for 5 days (Patient not taking: Reported on 05/02/2023), Disp: 70 g, Rfl: 1   ondansetron (ZOFRAN) 4 MG tablet,  Take 1 tablet (4 mg total) by mouth every 8 (eight) hours as needed for nausea or vomiting. (Patient not taking: Reported on 05/02/2023), Disp: 10 tablet, Rfl: 0   tinidazole (TINDAMAX) 500 MG tablet, Take 4 tablets (2,000 mg total) by mouth daily with breakfast. For two days (Patient not taking: Reported on 05/02/2023), Disp: 8 tablet, Rfl: 0  No Known Allergies        Objective:  Physical Exam  General: AAO x3, NAD  Dermatological: Bilateral hallux nails are hypertrophic, dystrophic with brown discoloration and incurvation of the nails distally.  There is no edema, erythema or signs of infection.  There are no open lesions.  Vascular: Dorsalis Pedis artery and Posterior Tibial artery pedal pulses are 2/4 bilateral with immedate capillary fill time.. There is no pain with calf compression, swelling, warmth, erythema.   Neruologic: Grossly intact via light touch bilateral.  Musculoskeletal: Moderate bunions present bilaterally with tenderness palpation tracking on the medial eminence of the first metatarsal head on the bunion.  No crepitation with MPJ range of motion.  No hypermobility of the first ray.  Gait: Unassisted, Nonantalgic.      Assessment:   Onychodystrophy, ingrown toenail bilateral hallux/bone spur; bunion     Plan:  -Treatment options discussed including all alternatives, risks, and complications -Etiology of symptoms were discussed -X-rays were obtained and reviewed with the patient.  3 views bilateral feet were obtained.  Moderate increase in first and metatarsal angle noted.  Moderate to the hallux there is spurring present at the distal phalanx.  No evidence of acute fracture.  Bilateral hallux toenail pain -I think this is coming from her nails being thickened but also considered but is starting presently x-ray.  We did extensive and long conversation regards to treatment options.  We discussed debridement, nail removal as well as possible exostectomy.  After long  discussion intraoperative nails were any complications or bleeding.  Discussed with her that if symptoms persist we will need to proceed with nail removal. -Nail sent for culture    Bilateral bunions -Discussed with conservative as well as surgical treatment options.  Originally she was a contemplating having surgery we discussed surgical options however given her time out of work she is to hold off on this.  Discussed the modifications, offloading, padding avoiding excess pressure.   Vivi Barrack DPM

## 2023-05-23 ENCOUNTER — Other Ambulatory Visit: Payer: Self-pay | Admitting: Podiatry

## 2023-05-29 ENCOUNTER — Encounter: Payer: Self-pay | Admitting: Podiatry

## 2023-05-30 ENCOUNTER — Other Ambulatory Visit: Payer: Self-pay | Admitting: Podiatry

## 2023-05-30 DIAGNOSIS — Z79899 Other long term (current) drug therapy: Secondary | ICD-10-CM

## 2023-06-11 ENCOUNTER — Ambulatory Visit: Payer: 59 | Admitting: Family Medicine

## 2023-06-11 ENCOUNTER — Telehealth: Payer: Self-pay

## 2023-06-11 NOTE — Progress Notes (Deleted)
    SUBJECTIVE:   CHIEF COMPLAINT / HPI:   Hypertension: - h/o HTN but discontinued all meds du eto orthostatic hypoTN 11/2021 - Medications: *** - Compliance: *** - Checking BP at home: *** - Denies any SOB, CP, vision changes, LE edema, medication SEs, or symptoms of hypotension - Diet: *** - Exercise: ***  ***due for mammogram   OBJECTIVE:   There were no vitals taken for this visit.  ***  ASSESSMENT/PLAN:   No problem-specific Assessment & Plan notes found for this encounter.     Caro Laroche, DO

## 2023-06-11 NOTE — Telephone Encounter (Signed)
 Patient calls nurse line regarding concerns for elevated BP. She reports that she feels like BP has been elevated for the last three days.   Reports that last night she was at work when she almost passed out. States that BP was 166/99. She also reports having blurred/double vision last night.   She endorses continued dizzy episodes today. She denies headache, current vision issues, chest pain or SHOB. Recommended that patient be evaluated today, offered appointment for this afternoon.   Patient reports that she cannot come in today due to work schedule. Requesting appointment for tomorrow morning. Scheduled in ATC for tomorrow at 1010. Advised of ED precautions.   Patient voices understanding.   Elsie Halo, RN

## 2023-06-11 NOTE — Telephone Encounter (Signed)
 Patient returns call to nurse line.   She does want to be seen today.   Scheduled for same day visit with Dr. Rumball this afternoon.   Elsie Halo, RN

## 2023-06-12 ENCOUNTER — Ambulatory Visit: Payer: 59

## 2023-06-12 ENCOUNTER — Ambulatory Visit (INDEPENDENT_AMBULATORY_CARE_PROVIDER_SITE_OTHER): Payer: 59 | Admitting: Family Medicine

## 2023-06-12 VITALS — BP 140/85 | HR 74 | Ht 64.0 in | Wt 208.2 lb

## 2023-06-12 DIAGNOSIS — Z720 Tobacco use: Secondary | ICD-10-CM

## 2023-06-12 DIAGNOSIS — I10 Essential (primary) hypertension: Secondary | ICD-10-CM | POA: Diagnosis not present

## 2023-06-12 MED ORDER — AMLODIPINE BESYLATE 5 MG PO TABS: 5.0000 mg | ORAL_TABLET | Freq: Every day | ORAL | 3 refills | Status: DC

## 2023-06-12 MED ORDER — VARENICLINE TARTRATE (STARTER) 0.5 MG X 11 & 1 MG X 42 PO TBPK: ORAL_TABLET | ORAL | 0 refills | Status: DC

## 2023-06-12 NOTE — Telephone Encounter (Signed)
Patient calls nurse line in regards to blood pressure and missed apt yesterday.   She reports she was on her way, however when she reached her car she had a flat tire.   She reports her blood pressure this morning was 160s/90s.  She reports she feels better today. She denies any headaches, dizziness or blurry/double vision.   Patient scheduled for this afternoon for evaluation.   Precautions discussed in the meantime.

## 2023-06-12 NOTE — Progress Notes (Signed)
    SUBJECTIVE:   CHIEF COMPLAINT / HPI:   [name] is a 43yo F w/ hx of HTN and tobacco use disorder that p/f HTN f/u. - Pt called 06/11/23 for elevated BP and double vision.  - suddenly felt dizzy at work, had to sit down. Reports having double vision in R eye. Was having difficulty reading her phone to call her husband. It lasted about 10 minutes and resolved.  Has not had similar episodes since then. 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 - Not currently on BP meds.  - Used to have to take HTN meds when she had COVID in 2022.   Tobacco Use - Feels like she is smoking more now. Smokes about 1ppd since 17.  - Tried quitting cold [place], chantix. - Reports chantix helped for about a week, but then she relapse.   OBJECTIVE:   BP (!) 140/85   Pulse 74   Ht 5\' 4"  (1.626 m)   Wt 208 lb 4 oz (94.5 kg)   LMP 05/27/2023   SpO2 100%   BMI 35.75 kg/m    General: Alert, pleasant woman. NAD. HEENT: NCAT. MMM. CV: RRR, no murmurs. Resp: CTAB, no wheezing or crackles. Normal WOB on RA.  Abm: Soft, nontender, nondistended. BS present. Ext: Moves all ext spontaneously Skin: Warm, well perfused   ASSESSMENT/PLAN:   Assessment & Plan Essential hypertension At goal today without medications.  Suspect elevated BP might have been from viral illness at the time.  Initially sent prescription for amlodipine, but blood pressure improved to normal so advised patient not to pick up this prescription. -Check BMP, CBC, TSH Tobacco use Start Chantix as below.  Plan for 8-month course.  Follow-up in 2 weeks to assess progress.  Tentatively planning to have last cigarette 1 week after starting Chantix.  Advised to start nicotine replacement therapy at the same time to help curb cravings.  Provided information for [place] quit line.     Patient Instructions  Good to see you today - Thank you for coming in  Things we discussed  today:  1) For your blood pressure,  - You blood pressure was normal today. We do not need to start a new medication.  - We will check a few labs today   2) For smoking cessation, - Start Chantix: On Day 1-3, take one 0.5mg  tablet once a day.  On Day 4-7, take one 0.5mg  tablet twice a day.  On Day 8 and onward, take 1mg  tablets twice a day.  - Common side effects include nausea and sleep disturbances. These could also be due to nicotine withdrawal. Take the medicine with food to decrease nausea. These usually improve as you continue taking the medicine.  - You can also get nicotine gum, lozenges, or patches. The gum or lozenges can be used to replace anytime you want to smoke a cigarette.  - The [place] Quitline also provides free assistance and resources for stopping smoking. They can female nicotine replacement products for free or reduced. You can call them at [phone] or [place]uitline[place].com  Come back to see me in 2 weeks.     Lincoln Brigham, MD [facility]

## 2023-06-12 NOTE — Patient Instructions (Addendum)
Good to see you today - Thank you for coming in  Things we discussed today:  1) For your blood pressure,  - You blood pressure was normal today. We do not need to start a new medication.  - We will check a few labs today   2) For smoking cessation, - Start Chantix: On Day 1-3, take one 0.5mg  tablet once a day.  On Day 4-7, take one 0.5mg  tablet twice a day.  On Day 8 and onward, take 1mg  tablets twice a day.  - Common side effects include nausea and sleep disturbances. These could also be due to nicotine withdrawal. Take the medicine with food to decrease nausea. These usually improve as you continue taking the medicine.  - You can also get nicotine gum, lozenges, or patches. The gum or lozenges can be used to replace anytime you want to smoke a cigarette.  - The [place] Quitline also provides free assistance and resources for stopping smoking. They can female nicotine replacement products for free or reduced. You can call them at (307)202-0369 or [facility]uitline[place]com  Come back to see me in 2 weeks.

## 2023-06-13 LAB — BASIC METABOLIC PANEL
BUN/Creatinine Ratio: 15 (ref 9–23)
BUN: 11 mg/dL (ref 6–24)
CO2: 23 mmol/L (ref 20–29)
Calcium: 9.1 mg/dL (ref 8.7–10.2)
Chloride: 104 mmol/L (ref 96–106)
Creatinine, Ser: 0.71 mg/dL (ref 0.57–1.00)
Glucose: 108 mg/dL — ABNORMAL HIGH (ref 70–99)
Potassium: 4 mmol/L (ref 3.5–5.2)
Sodium: 139 mmol/L (ref 134–144)
eGFR: 107 mL/min/{1.73_m2} (ref >59)

## 2023-06-13 LAB — CBC
Hematocrit: 37.4 % (ref 34.0–46.6)
Hemoglobin: 11.9 g/dL (ref 11.1–15.9)
MCH: 26.7 pg (ref 26.6–33.0)
MCHC: 31.8 g/dL (ref 31.5–35.7)
MCV: 84 fL (ref 79–97)
Platelets: 318 10*3/uL (ref 150–450)
RBC: 4.46 x10E6/uL (ref 3.77–5.28)
RDW: 13.5 % (ref 11.7–15.4)
WBC: 10 10*3/uL (ref 3.4–10.8)

## 2023-06-13 LAB — TSH RFX ON ABNORMAL TO FREE T4: TSH: 0.386 u[IU]/mL — ABNORMAL LOW (ref 0.450–4.500)

## 2023-06-13 LAB — T4F: T4,Free (Direct): 1.1 ng/dL (ref 0.82–1.77)

## 2023-06-16 NOTE — Assessment & Plan Note (Signed)
At goal today without medications.  Suspect elevated BP might have been from viral illness at the time.  Initially sent prescription for amlodipine, but blood pressure improved to normal so advised patient not to pick up this prescription. -Check BMP, CBC, TSH

## 2023-06-16 NOTE — Assessment & Plan Note (Signed)
Start Chantix as below.  Plan for 9-month course.  Follow-up in 2 weeks to assess progress.  Tentatively planning to have last cigarette 1 week after starting Chantix.  Advised to start nicotine replacement therapy at the same time to help curb cravings.  Provided information for [place] quit line.

## 2023-06-24 ENCOUNTER — Encounter: Payer: Self-pay | Admitting: *Deleted

## 2023-06-26 ENCOUNTER — Ambulatory Visit: Payer: 59 | Admitting: General Practice

## 2023-06-26 ENCOUNTER — Other Ambulatory Visit (HOSPITAL_COMMUNITY)
Admission: RE | Admit: 2023-06-26 | Discharge: 2023-06-26 | Disposition: A | Discharge status: Home / Self Care | Payer: 59 | Source: Ambulatory Visit | Attending: Obstetrics and Gynecology | Admitting: Obstetrics and Gynecology

## 2023-06-26 VITALS — BP 150/82 | HR 82 | Ht 64.0 in | Wt 203.0 lb

## 2023-06-26 DIAGNOSIS — N898 Other specified noninflammatory disorders of vagina: Secondary | ICD-10-CM | POA: Insufficient documentation

## 2023-06-26 LAB — POCT URINALYSIS DIPSTICK
Bilirubin, UA: NEGATIVE
Blood, UA: NEGATIVE
Glucose, UA: NEGATIVE
Ketones, UA: NEGATIVE
Leukocytes, UA: NEGATIVE
Nitrite, UA: NEGATIVE
Protein, UA: NEGATIVE
Spec Grav, UA: 1.015 (ref 1.010–1.025)
Urobilinogen, UA: 1 U/dL
pH, UA: 6 (ref 5.0–8.0)

## 2023-06-26 MED ORDER — METRONIDAZOLE 500 MG PO TABS: 500.0000 mg | ORAL_TABLET | Freq: Two times a day (BID) | ORAL | 0 refills | Status: DC

## 2023-06-26 NOTE — Progress Notes (Signed)
SUBJECTIVE:  43 y.o. female complains of malodorous vaginal discharge for 1 week(s). Denies abnormal vaginal bleeding or significant pelvic pain or fever. No UTI symptoms. Denies history of known exposure to STD. Pt reports recurring BV.   Patient's last menstrual period was 05/27/2023.  OBJECTIVE:  She appears well, afebrile. Urine dipstick: negative for all components.  ASSESSMENT:  Vaginal Discharge  Vaginal Odor   PLAN:  GC, chlamydia, trichomonas, BVAG, CVAG probe sent to lab. Treatment: To be determined once lab results are received ROV prn if symptoms persist or worsen.

## 2023-06-27 ENCOUNTER — Encounter: Payer: Self-pay | Admitting: Obstetrics and Gynecology

## 2023-06-27 LAB — CERVICOVAGINAL ANCILLARY ONLY
Chlamydia: NEGATIVE
Comment: NEGATIVE
Comment: NEGATIVE
Comment: NORMAL
Neisseria Gonorrhea: NEGATIVE
Trichomonas: NEGATIVE

## 2023-06-30 ENCOUNTER — Other Ambulatory Visit: Payer: Self-pay

## 2023-06-30 MED ORDER — FLUCONAZOLE 150 MG PO TABS: 150.0000 mg | ORAL_TABLET | Freq: Once | ORAL | 0 refills | Status: AC

## 2023-06-30 NOTE — Progress Notes (Signed)
Returned call, no answer, pt requesting rx for yeast after antibiotics, sent per protocol.

## 2023-07-01 ENCOUNTER — Encounter: Payer: Self-pay | Admitting: Obstetrics and Gynecology

## 2023-07-01 LAB — URINE CULTURE

## 2023-07-04 MED ORDER — NITROFURANTOIN MONOHYD MACRO 100 MG PO CAPS: 100.0000 mg | ORAL_CAPSULE | Freq: Two times a day (BID) | ORAL | 0 refills | Status: AC

## 2023-08-07 ENCOUNTER — Telehealth: Payer: Self-pay

## 2023-08-07 NOTE — Telephone Encounter (Signed)
 Returned call, pt states that she was treated for uti in Feb, but now she is having symptoms again such as frequent urination with discomfort. Transferred to scheduler for appt.

## 2023-08-08 ENCOUNTER — Ambulatory Visit

## 2023-08-08 VITALS — BP 139/89 | HR 86

## 2023-08-08 DIAGNOSIS — R300 Dysuria: Secondary | ICD-10-CM

## 2023-08-08 LAB — POCT URINALYSIS DIPSTICK
Bilirubin, UA: NEGATIVE
Glucose, UA: NEGATIVE
Protein, UA: NEGATIVE
Spec Grav, UA: 1.02 (ref 1.010–1.025)
Urobilinogen, UA: 0.2 U/dL
pH, UA: 6 (ref 5.0–8.0)

## 2023-08-08 MED ORDER — SULFAMETHOXAZOLE-TRIMETHOPRIM 800-160 MG PO TABS: 1.0000 | ORAL_TABLET | Freq: Two times a day (BID) | ORAL | 0 refills | Status: DC

## 2023-08-08 NOTE — Progress Notes (Signed)
 SUBJECTIVE: [name] is a 43 y.o. female who complains of urinary frequency, urgency and dysuria since previous UTI in Feb (feels like it only got worse), without flank pain, fever, chills, or abnormal vaginal discharge or bleeding.   OBJECTIVE: Appears well, in no apparent distress.  Vital signs are normal. Urine dipstick shows positive for leukocytes, nitrates, RBCs.    ASSESSMENT: Dysuria  PLAN: Treatment per orders.  Call or return to clinic prn if these symptoms worsen or fail to improve as anticipated.

## 2023-08-12 LAB — URINE CULTURE

## 2023-08-14 ENCOUNTER — Encounter: Payer: Self-pay | Admitting: Obstetrics & Gynecology

## 2023-08-14 MED ORDER — CIPROFLOXACIN HCL 500 MG PO TABS
500.0000 mg | ORAL_TABLET | Freq: Two times a day (BID) | ORAL | 0 refills | Status: DC
Start: 2023-08-14 — End: 2023-11-25

## 2023-08-14 NOTE — Addendum Note (Signed)
 Addended by: Adam Phenix on: 08/14/2023 10:17 AM   Modules accepted: Orders

## 2023-11-04 ENCOUNTER — Encounter: Payer: Self-pay | Admitting: *Deleted

## 2023-11-24 ENCOUNTER — Encounter: Payer: Self-pay | Admitting: Family Medicine

## 2023-11-24 ENCOUNTER — Ambulatory Visit (INDEPENDENT_AMBULATORY_CARE_PROVIDER_SITE_OTHER): Admitting: Family Medicine

## 2023-11-24 ENCOUNTER — Other Ambulatory Visit (HOSPITAL_COMMUNITY)
Admission: RE | Admit: 2023-11-24 | Discharge: 2023-11-24 | Disposition: A | Discharge status: Home / Self Care | Source: Ambulatory Visit | Attending: Family Medicine | Admitting: Family Medicine

## 2023-11-24 VITALS — BP 149/98 | HR 84 | Ht 64.0 in | Wt 205.0 lb

## 2023-11-24 DIAGNOSIS — N946 Dysmenorrhea, unspecified: Secondary | ICD-10-CM | POA: Diagnosis present

## 2023-11-24 DIAGNOSIS — Z124 Encounter for screening for malignant neoplasm of cervix: Secondary | ICD-10-CM | POA: Diagnosis not present

## 2023-11-24 DIAGNOSIS — N941 Unspecified dyspareunia: Secondary | ICD-10-CM

## 2023-11-24 NOTE — Progress Notes (Unsigned)
 Pt c/o dysmenorrhea for years. Pt also pain with intercourse for the last 6 months. Last pelvic US  2022

## 2023-11-25 ENCOUNTER — Other Ambulatory Visit (HOSPITAL_COMMUNITY)
Admission: RE | Admit: 2023-11-25 | Discharge: 2023-11-25 | Disposition: A | Discharge status: Home / Self Care | Source: Ambulatory Visit | Attending: Family Medicine | Admitting: Family Medicine

## 2023-11-25 ENCOUNTER — Encounter: Payer: Self-pay | Admitting: Family Medicine

## 2023-11-25 DIAGNOSIS — N941 Unspecified dyspareunia: Secondary | ICD-10-CM | POA: Diagnosis present

## 2023-11-25 DIAGNOSIS — N946 Dysmenorrhea, unspecified: Secondary | ICD-10-CM | POA: Insufficient documentation

## 2023-11-25 NOTE — Progress Notes (Signed)
   PROBLEM VISIT Patient name: LORY NOWACZYK MRN [id number]  Date of birth: 1980/12/03 Chief Complaint:   Dysmenorrhea  History of Present Illness:   ERMINIE FOULKS is a 43 y.o. G51P1020 female being seen today for dysmenorrhea.  Ongoing for years, not interested in contraception options to manage, but will consider. Has tried NSAIDs in past, not much relief. Was given Vicoprofen in past as a sample, which helped significantly.  Also has been noticing dyspareunia, specifically over last 6 months. Denies any life changes, new stressors, stressful situations. Unable to identify at which point of intercourse most of her pain is. Had BV approx 6 months ago, felt sxs never really went away. Not having pain w BM or w urination.   Pertinent History Reviewed:  Reviewed past medical,surgical, social and family history.  Reviewed problem list, medications and allergies. Physical Assessment:   Vitals:   11/24/23 1620 11/24/23 1626  BP: (!) 168/96 (!) 149/98  Pulse: 85 84  Weight: 205 lb (93 kg)   Height: 5' 4 (1.626 m)   Body mass index is 35.19 kg/m.        Physical Examination:   General appearance - well appearing, and in no distress  Mental status - alert, oriented to person, place, and time  Psych:  She has a normal mood and affect  Skin - warm and dry, normal color, no suspicious lesions noted  Chest - effort normal, all lung fields clear to auscultation bilaterally  Heart - normal rate and regular rhythm  Neck:  midline trachea, no thyromegaly or nodules  Abdomen - soft, nontender, nondistended, no masses or organomegaly  Pelvic - VULVA: normal appearing vulva with no masses, tenderness or lesions  VAGINA: normal appearing vagina with normal color and discharge, no lesions  CERVIX: normal appearing cervix without discharge or lesions, no CMT  Thin prep pap is done with HR HPV cotesting  UTERUS: uterus is felt to be normal size, shape, consistency and nontender   ADNEXA: No  adnexal masses or tenderness noted.  Extremities:  No swelling or varicosities noted  Chaperone present for exam  No results found for this or any previous visit (from the past 24 hours).  Assessment & Plan:  1) Dysmenorrhea: Recommend NSAIDs, would consider management with IUD vs OCPs, pt will consider options and let us  know if she would like to proceed.   2) Dyspareunia: Unclear etiology, acute onset in absence of any specific triggers. Pap, STI testing, Mycoplasma swabs done today to further evaluate. Referral to pelvic floor PT sent.   Orders Placed This Encounter  Procedures   Mycoplasma / ureaplasma culture    Follow-up: Return in about 1 year (around 11/23/2024) for Annual or sooner for contraception counseling.  Alain Sor, MD 11/25/2023 4:35 PM

## 2023-11-27 LAB — CERVICOVAGINAL ANCILLARY ONLY
Bacterial Vaginitis (gardnerella): POSITIVE — AB
Candida Glabrata: NEGATIVE
Candida Vaginitis: NEGATIVE
Comment: NEGATIVE
Comment: NEGATIVE
Comment: NEGATIVE
Comment: NEGATIVE
Trichomonas: NEGATIVE

## 2023-12-01 ENCOUNTER — Other Ambulatory Visit: Payer: Self-pay

## 2023-12-01 DIAGNOSIS — Z2239 Carrier of other specified bacterial diseases: Secondary | ICD-10-CM

## 2023-12-01 LAB — MYCOPLASMA / UREAPLASMA CULTURE
Mycoplasma hominis Culture: NEGATIVE
Ureaplasma urealyticum: POSITIVE — AB

## 2023-12-01 MED ORDER — METRONIDAZOLE 500 MG PO TABS: 500.0000 mg | ORAL_TABLET | Freq: Two times a day (BID) | ORAL | 0 refills | Status: DC

## 2023-12-02 ENCOUNTER — Other Ambulatory Visit: Payer: Self-pay

## 2023-12-02 MED ORDER — AZITHROMYCIN 250 MG PO TABS: 1000.0000 mg | ORAL_TABLET | Freq: Once | ORAL | 0 refills | Status: AC

## 2023-12-02 MED ORDER — FLUCONAZOLE 150 MG PO TABS
150.0000 mg | ORAL_TABLET | Freq: Once | ORAL | 0 refills | Status: AC
Start: 2023-12-02 — End: 2023-12-02

## 2023-12-04 LAB — CYTOLOGY - PAP
Adequacy: ABNORMAL
Chlamydia: NEGATIVE
Comment: NEGATIVE
Comment: NEGATIVE
Comment: NEGATIVE
Comment: NORMAL
HSV1: NEGATIVE
HSV2: NEGATIVE
Neisseria Gonorrhea: NEGATIVE

## 2023-12-07 ENCOUNTER — Ambulatory Visit: Payer: Self-pay | Admitting: Family Medicine

## 2023-12-07 DIAGNOSIS — R102 Pelvic and perineal pain: Secondary | ICD-10-CM

## 2023-12-10 ENCOUNTER — Encounter: Payer: Self-pay | Admitting: Obstetrics and Gynecology

## 2023-12-10 ENCOUNTER — Ambulatory Visit: Admitting: Physician Assistant

## 2023-12-10 ENCOUNTER — Other Ambulatory Visit (HOSPITAL_COMMUNITY)
Admission: RE | Admit: 2023-12-10 | Discharge: 2023-12-10 | Disposition: A | Discharge status: Home / Self Care | Source: Ambulatory Visit | Attending: Physician Assistant | Admitting: Physician Assistant

## 2023-12-10 VITALS — BP 133/92 | HR 87 | Ht 64.0 in | Wt 206.0 lb

## 2023-12-10 DIAGNOSIS — B3731 Acute candidiasis of vulva and vagina: Secondary | ICD-10-CM

## 2023-12-10 DIAGNOSIS — R87615 Unsatisfactory cytologic smear of cervix: Secondary | ICD-10-CM | POA: Diagnosis present

## 2023-12-10 DIAGNOSIS — R102 Pelvic and perineal pain: Secondary | ICD-10-CM | POA: Diagnosis not present

## 2023-12-10 MED ORDER — FLUCONAZOLE 150 MG PO TABS: 150.0000 mg | ORAL_TABLET | ORAL | 0 refills | Status: DC

## 2023-12-10 NOTE — Progress Notes (Signed)
   GYNECOLOGY PROBLEM  VISIT ENCOUNTER NOTE  Subjective:   [name] is a 43 y.o. G22P1020 female here for a problem GYN visit.  Current complaints: Intermittent pelvic pain. Sharp in nature. Worse with more movement. Has normal urination, bowel movements. Does have hx of C/S x2. No other surgeries. No hx of trauma. Recently treated for yeast infection but does not feel symptoms have resolved.  Insufficient cells on last pap. Here for repeat.   Gynecologic History Patient's last menstrual period was 11/14/2023.  Contraception: abstinence  Health Maintenance Due  Topic Date Due   COVID-19 Vaccine (1) Never done   DTaP/Tdap/Td (1 - Tdap) Never done   Pneumococcal Vaccine 81-33 Years old (1 of 2 - PCV) Never done   Hepatitis B Vaccines (1 of 3 - 19+ 3-dose series) Never done   HPV VACCINES (1 - 3-dose SCDM series) Never done   MAMMOGRAM  05/01/2022   Colonoscopy  Never done    The following portions of the patient's history were reviewed and updated as appropriate: allergies, current medications, past family history, past medical history, past social history, past surgical history and problem list.   Objective:  BP (!) 133/92   Pulse 87   Ht 5' 4 (1.626 m)   Wt 206 lb (93.4 kg)   LMP 11/14/2023   BMI 35.36 kg/m   Gen: well appearing, NAD HEENT: no scleral icterus CV: RR Lung: Normal WOB Ext: warm well perfused Abd: abdomen soft, non-tender, non-distended. Well-healed remote C/S scar  PELVIC: Normal appearing external genitalia; normal appearing vaginal mucosa and cervix. Small clumps of white discharge present on vaginal walls. Pap smear obtained.  Normal uterine size, no other palpable masses, no uterine or adnexal tenderness.   Assessment and Plan:  1. Encounter for repeat Pap smear due to previous insufficient cervical cells (Primary) Repeat pap today. - Cytology - PAP( [facility])  2. Yeast infection involving the vagina and surrounding area Visually  present on exam today. - fluconazole  (DIFLUCAN ) 150 MG tablet; Take 1 tablet (150 mg total) by mouth every 3 (three) days.  Dispense: 2 tablet; Refill: 0  3. Pelvic pain Suspect most likely MSK in nature +/- sensory changes after 2 C/S. No red flag symptoms of weight loss, incontinence, trauma, abnormal exam. Patient amenable to pelvic floor PT. - Ambulatory referral to Physical Therapy  Please refer to After Visit Summary for other counseling recommendations.   Return in about 1 year (around 12/09/2024) for Annual.  Savannah CHRISTELLA Moats, MD [facility] Practice- [facility]

## 2023-12-10 NOTE — Progress Notes (Signed)
 No concerns today. Questions regarding charge for repap. Started on BP meds few day ago.

## 2023-12-16 ENCOUNTER — Ambulatory Visit: Payer: Self-pay | Admitting: Obstetrics and Gynecology

## 2023-12-16 LAB — CYTOLOGY - PAP
Comment: NEGATIVE
Diagnosis: NEGATIVE
High risk HPV: NEGATIVE

## 2023-12-17 MED ORDER — NAPROXEN 500 MG PO TABS: 500.0000 mg | ORAL_TABLET | Freq: Two times a day (BID) | ORAL | 0 refills | Status: DC

## 2023-12-18 ENCOUNTER — Telehealth: Payer: Self-pay

## 2023-12-18 NOTE — Telephone Encounter (Signed)
 Returned call and s/w pt about cramps and medication, messaged provider to review requests

## 2023-12-19 ENCOUNTER — Other Ambulatory Visit: Payer: Self-pay | Admitting: Family Medicine

## 2023-12-19 DIAGNOSIS — N946 Dysmenorrhea, unspecified: Secondary | ICD-10-CM

## 2023-12-19 MED ORDER — SLYND 4 MG PO TABS: 1.0000 | ORAL_TABLET | Freq: Every day | ORAL | 12 refills | Status: DC

## 2023-12-29 ENCOUNTER — Ambulatory Visit: Attending: Family Medicine

## 2023-12-29 ENCOUNTER — Ambulatory Visit (INDEPENDENT_AMBULATORY_CARE_PROVIDER_SITE_OTHER): Admitting: Family Medicine

## 2023-12-29 VITALS — BP 140/90 | HR 86 | Ht 64.0 in | Wt 205.5 lb

## 2023-12-29 DIAGNOSIS — R5383 Other fatigue: Secondary | ICD-10-CM

## 2023-12-29 DIAGNOSIS — I10 Essential (primary) hypertension: Secondary | ICD-10-CM | POA: Diagnosis not present

## 2023-12-29 MED ORDER — AMLODIPINE BESYLATE 10 MG PO TABS: 10.0000 mg | ORAL_TABLET | Freq: Every day | ORAL | 1 refills | Status: AC

## 2023-12-29 NOTE — Progress Notes (Unsigned)
 EP to read.

## 2023-12-29 NOTE — Progress Notes (Signed)
    SUBJECTIVE:   CHIEF COMPLAINT / HPI: BP, issues?  BP Everyday does not feel herself Has dizziness and lightheadness. Does notice this occurs when her blood pressure is high. One time at work when she felt like she was going to pass out her BP was ~130/91. Diastolic is normally in the 90s. Sometimes lightheadness is related to standing up. Has Amlodipine  at home 2.5mg , taking everyday. Eating and drinking normally. Job is shutting down.  Fatigue Feels energy has been down. Unknown if snoring. No difficulty breathing. Sometimes has palpitations when lightheaded. Notices more with exertion  Last seen by Dr. Elicia 06/12/23, BP controlled.  PERTINENT  PMH / PSH: Orthostatic Hypotension, Tobacco abuse  OBJECTIVE:   BP (!) 140/90   Pulse 86   Ht 5' 4 (1.626 m)   Wt 205 lb 8 oz (93.2 kg)   LMP 12/25/2023   SpO2 100%   BMI 35.27 kg/m   General: NAD Neuro: A&O Cardiovascular: RRR, no murmurs,  Respiratory: normal WOB on RA, CTAB, no wheezes, ronchi or rales Abdomen: soft, NTTP, no rebound or guarding Extremities: Moving all 4 extremities equally, no peripheral edema  EKG -sinus rhythm, possible atrial enlargement however appears to be significant wander with respirations clouding interpretation, poor R wave progression, consistent with previous EKG  ASSESSMENT/PLAN:   Assessment & Plan Other fatigue Likely in the setting of stress due to uncontrolled anxiety. GAD 7 positive. Therapy resources provided and discussed.  Follow-up 2 weeks.  Repeat TSH, T4, T3.  Rule out arrhythmia as a possible cause of symptoms with Zio patch, EKG reviewed without obvious cause.  Consider sleep apnea eval. Essential hypertension Uncontrolled, diastolic moderately elevated, systolic mildly elevated.  Increase amlodipine  to 10 mg daily.  Follow-up 2 weeks. Discussed measuring at home.  Return in about 2 weeks (around 01/12/2024).  Savannah Provencal, MD [facility]

## 2023-12-29 NOTE — Patient Instructions (Addendum)
 It was great to see you! Thank you for allowing me to participate in your care!  Our plans for today:  - I have increased your amlodipine  to 10mg  daily, I have sent this to your pharmacy. - I will see you again in 2 weeks to see how your blood pressure is doing. - I will let you know the results of your labs.   Please arrive 15 minutes PRIOR to your next scheduled appointment time! If you do not, this affects OTHER patients' care.  Take care and seek immediate care sooner if you develop any concerns.   Ozell Provencal, MD, PGY-3 [facility] 1:49 PM 12/29/2023  [facility]    Therapy and Counseling Resources Most providers on this list will take Medicaid. Patients with commercial insurance or Medicare should contact their insurance company to get a list of in network providers.  [facility] (takes children) Location 1: [address], [address] [place], [place] [address] Location 2: [address] [place], [place] [address] [phone]   [facility] (spanish speaking therapist available)(habla espanol)(take medicare and medicaid)  [address], [place], [place] [address], [place]  [email or web address] [phone]  [facility] [address] [place], [place] [address] [phone]  [facility]   [address] [place], [place] [address]      [phone]  [facility] (spanish available) [address]  [place], [place] [address] [phone]  [facility] (take [facility] and medicare) [address]  [place], [place] [address]       [phone]     [facility] (virtual only) [phone]  [facility] [address] [place], [place] [phone]  [facility]:  [address] [place] [phone]  [facility]:  [address] [place] [phone]  [facility] (under & uninsured) [address]   [place] [phone]    [email or web address]    [facility] [address]  [place]    [phone]  [facility] -[address]     Phone:  [phone]     [facility]-  [address]  [phone]   [facility] [address]      [place], [place] [phone]  [facility]: [address] [place], [place], [place]  [phone]   [facility] (Spanish therapist) [email or web address]  [address] [place]  [address]   [place] [address]    [phone]    [facility]   [address],  [place], [place]  [phone]   [facility]  [address] [place]  [phone]  [facility]  [address] [place], [place]        [phone]  Open Access/Walk In Clinic under & uninsured  [facility]  [address] [place], [place] [phone] Crisis [phone]  [facility] [address],  (Spanish)   [address] , [place]: [phone]) 8:30 - 12; 1 - 2:30  [facility] [employer],  [address] [place], [place]    [phone]):8:30 - 12; 2 - 3PM  [facility] [employer],  [address],  [place]; [phone]):   Mon - Fri 8 AM - 5 PM  [facility] [address] [place]  MWF 12:30 to 3:00 or call to schedule an appointment  [phone]  Specific Provider options Psychology Today  [email or web address].psychologytoday.com/us  click on find a therapist  enter your zip code left side and select or tailor a therapist for your specific need.   [facility] Provider Directory [email or web address]  (Medicaid)   Follow all drop down to find a provider  Social Support program [facility] [phone] or  [employer] [address], [place], [place] Recovery support and educational   24- Hour Availability:   [facility]  [address] [place], [place] [phone] Crisis [phone]  [facility] [phone]  [facility]  [phone]   [facility]  [phone] (after hours)  Therapeutic Alternative/Mobile Crisis   [phone]  [place]  National Suicide Hotline  440-190-2787 MERRILYN)  Call 911 or go to emergency room  [facility]  [phone]);  [place] and [employer]  [phone]); [place], [place], [employer], [place], [place], [place], [place]

## 2023-12-29 NOTE — Assessment & Plan Note (Signed)
 Uncontrolled, diastolic moderately elevated, systolic mildly elevated.  Increase amlodipine  to 10 mg daily.  Follow-up 2 weeks. Discussed measuring at home.

## 2023-12-30 LAB — TSH RFX ON ABNORMAL TO FREE T4: TSH: 0.561 u[IU]/mL (ref 0.450–4.500)

## 2023-12-30 LAB — T3, FREE: T3, Free: 2.4 pg/mL (ref 2.0–4.4)

## 2023-12-31 ENCOUNTER — Ambulatory Visit: Payer: Self-pay | Admitting: Family Medicine

## 2024-01-12 ENCOUNTER — Other Ambulatory Visit: Payer: Self-pay

## 2024-01-12 DIAGNOSIS — N946 Dysmenorrhea, unspecified: Secondary | ICD-10-CM

## 2024-01-12 MED ORDER — SLYND 4 MG PO TABS: 1.0000 | ORAL_TABLET | Freq: Every day | ORAL | 12 refills | Status: DC

## 2024-01-14 ENCOUNTER — Ambulatory Visit: Admitting: Family Medicine

## 2024-01-14 NOTE — Progress Notes (Deleted)
    SUBJECTIVE:   CHIEF COMPLAINT / HPI:   F/u for fatigue and HTN At last visit thyroid function was normal.  Patient was ordered a Zio patch Additionally her amlodipine  was increased to 10 mg daily ***  PERTINENT  PMH / PSH: ***  OBJECTIVE:   LMP 12/25/2023   ***  ASSESSMENT/PLAN:   Assessment & Plan    Savannah Coward, MD [facility]

## 2024-02-05 ENCOUNTER — Ambulatory Visit: Payer: Self-pay | Attending: Family Medicine | Admitting: Physical Therapy

## 2024-03-20 ENCOUNTER — Other Ambulatory Visit: Payer: Self-pay

## 2024-03-20 ENCOUNTER — Emergency Department (HOSPITAL_COMMUNITY): Payer: Self-pay

## 2024-03-20 ENCOUNTER — Emergency Department (HOSPITAL_COMMUNITY)
Admission: EM | Admit: 2024-03-20 | Discharge: 2024-03-20 | Disposition: A | Discharge status: Home / Self Care | Payer: Self-pay | Attending: Emergency Medicine | Admitting: Emergency Medicine

## 2024-03-20 ENCOUNTER — Encounter (HOSPITAL_COMMUNITY): Payer: Self-pay | Admitting: Pharmacy Technician

## 2024-03-20 DIAGNOSIS — Z79899 Other long term (current) drug therapy: Secondary | ICD-10-CM | POA: Insufficient documentation

## 2024-03-20 DIAGNOSIS — R102 Pelvic and perineal pain unspecified side: Secondary | ICD-10-CM

## 2024-03-20 DIAGNOSIS — I10 Essential (primary) hypertension: Secondary | ICD-10-CM | POA: Insufficient documentation

## 2024-03-20 DIAGNOSIS — N80129 Deep endometriosis of ovary, unspecified ovary: Secondary | ICD-10-CM | POA: Insufficient documentation

## 2024-03-20 LAB — COMPREHENSIVE METABOLIC PANEL WITH GFR
ALT: 29 U/L (ref 0–44)
AST: 20 U/L (ref 15–41)
Albumin: 4 g/dL (ref 3.5–5.0)
Alkaline Phosphatase: 73 U/L (ref 38–126)
Anion gap: 8 (ref 5–15)
BUN: 10 mg/dL (ref 6–20)
CO2: 22 mmol/L (ref 22–32)
Calcium: 9.2 mg/dL (ref 8.9–10.3)
Chloride: 105 mmol/L (ref 98–111)
Creatinine, Ser: 0.86 mg/dL (ref 0.44–1.00)
GFR, Estimated: >60 mL/min (ref >60)
Glucose, Bld: 105 mg/dL — ABNORMAL HIGH (ref 70–99)
Potassium: 4.3 mmol/L (ref 3.5–5.1)
Sodium: 135 mmol/L (ref 135–145)
Total Bilirubin: 0.6 mg/dL (ref 0.0–1.2)
Total Protein: 6.8 g/dL (ref 6.5–8.1)

## 2024-03-20 LAB — URINALYSIS, ROUTINE W REFLEX MICROSCOPIC
Bilirubin Urine: NEGATIVE
Glucose, UA: NEGATIVE mg/dL
Hgb urine dipstick: NEGATIVE
Ketones, ur: NEGATIVE mg/dL
Leukocytes,Ua: NEGATIVE
Nitrite: NEGATIVE
Protein, ur: NEGATIVE mg/dL
Specific Gravity, Urine: 1.024 (ref 1.005–1.030)
pH: 5 (ref 5.0–8.0)

## 2024-03-20 LAB — HCG, SERUM, QUALITATIVE: Preg, Serum: NEGATIVE

## 2024-03-20 LAB — CBC
HCT: 39.3 % (ref 36.0–46.0)
Hemoglobin: 12.2 g/dL (ref 12.0–15.0)
MCH: 26.5 pg (ref 26.0–34.0)
MCHC: 31 g/dL (ref 30.0–36.0)
MCV: 85.4 fL (ref 80.0–100.0)
Platelets: 277 K/uL (ref 150–400)
RBC: 4.6 MIL/uL (ref 3.87–5.11)
RDW: 14.7 % (ref 11.5–15.5)
WBC: 10 K/uL (ref 4.0–10.5)
nRBC: 0 % (ref 0.0–0.2)

## 2024-03-20 LAB — LIPASE, BLOOD: Lipase: 30 U/L (ref 11–51)

## 2024-03-20 MED ORDER — ACETAMINOPHEN 500 MG PO TABS
1000.0000 mg | ORAL_TABLET | Freq: Once | ORAL | Status: AC
  Administered 2024-03-20: 1000 mg via ORAL
  Filled 2024-03-20: qty 2

## 2024-03-20 MED ORDER — KETOROLAC TROMETHAMINE 10 MG PO TABS: 10.0000 mg | ORAL_TABLET | Freq: Four times a day (QID) | ORAL | 0 refills | Status: DC | PRN

## 2024-03-20 MED ORDER — HYDROMORPHONE HCL 1 MG/ML IJ SOLN
0.5000 mg | Freq: Once | INTRAMUSCULAR | Status: AC
  Administered 2024-03-20: 0.5 mg via INTRAVENOUS
  Filled 2024-03-20: qty 1

## 2024-03-20 MED ORDER — HYDROMORPHONE HCL 1 MG/ML IJ SOLN
0.5000 mg | Freq: Once | INTRAMUSCULAR | Status: DC
  Filled 2024-03-20: qty 1

## 2024-03-20 MED ORDER — ONDANSETRON HCL 4 MG/2ML IJ SOLN
4.0000 mg | Freq: Once | INTRAMUSCULAR | Status: AC
  Administered 2024-03-20: 4 mg via INTRAVENOUS
  Filled 2024-03-20: qty 2

## 2024-03-20 MED ORDER — IOHEXOL 350 MG/ML SOLN
75.0000 mL | Freq: Once | INTRAVENOUS | Status: AC | PRN
  Administered 2024-03-20: 75 mL via INTRAVENOUS

## 2024-03-20 MED ORDER — OXYCODONE HCL 5 MG PO TABS: 5.0000 mg | ORAL_TABLET | Freq: Four times a day (QID) | ORAL | 0 refills | Status: DC | PRN

## 2024-03-20 MED ORDER — KETOROLAC TROMETHAMINE 15 MG/ML IJ SOLN
15.0000 mg | Freq: Once | INTRAMUSCULAR | Status: AC
  Administered 2024-03-20: 15 mg via INTRAVENOUS
  Filled 2024-03-20: qty 1

## 2024-03-20 NOTE — Discharge Instructions (Addendum)
 Thank you for coming to [facility]. You were seen for abdominal pain. We did an exam, labs, and imaging, and these showed an endometrioma and other cysts on your ovary. This possibly indicates that you have endometriosis. Please continue taking your birth control pill Slynd . You can take Tylenol  1,000 mg every 8 hours. You can take toradol  10 mg every 6 hours as needed for pain as well.  This is NSAID pain medication.  Please not take additional NSAIDs including ibuprofen , naproxen , meloxicam , or Aleve  while taking this medication.  You can take oxycodone  5 mg every 4-6 hours as needed for severe pain.  While you are taking the oxycodone , please be careful of drowsiness and do not drive or operate machinery.  Please take MiraLAX 1-2 capfuls per day while you are taking the oxycodone  to help with constipation.  Please follow-up with gynecology Dr. Jeralyn in clinic.  You can call her office on Monday morning to make an appointment within the next 1 to 2 weeks for follow-up.  Please stop smoking. You will start healing from that the day that you stop!  Your blood pressure was also noted to be high while you are in the department.  Please follow-up with referring care physician within 1 to 2 weeks to discuss your hypertension.   Do not hesitate to return to the ED or call 911 if you experience: -Worsening symptoms -Nausea/vomiting so severe you cannot eat, drink, or take your medications -Chest pain, shortness of breath -Lightheadedness, passing out -Fevers/chills -Anything else that concerns you

## 2024-03-20 NOTE — ED Notes (Signed)
 Patient transported to Ultrasound

## 2024-03-20 NOTE — ED Provider Notes (Signed)
 [facility] Provider Note   CSN: [id number] Arrival date & time: 03/20/24  1245     History {Add pertinent medical, surgical, social history, OB history to HPI:1} Chief Complaint  Patient presents with   Abdominal Pain    [name] is a 43 y.o. female with PMH as listed below who presents with LLQ abdominal pain onset last night. Denies NVD. Denies dysuria, hematuria, vaginal sxs including bleeding/abnormal DC/itching/burning, diarrhea/constipation. Constant, rated 15/10. Feels similar to when she had a tubal pregnancy in the past or when she had appendicitis in the past. No f/c. Abd surgical history includes ectopic, appendectomy, and D&C.    Past Medical History:  Diagnosis Date   Acute appendicitis 12/06/2011   HTN (hypertension)        Home Medications Prior to Admission medications   Medication Sig Start Date End Date Taking? Authorizing Provider  amLODipine  (NORVASC ) 10 MG tablet Take 1 tablet (10 mg total) by mouth at bedtime. 12/29/23   Alba Sharper, MD  Drospirenone  (SLYND ) 4 MG TABS Take 1 tablet (4 mg total) by mouth daily. 01/12/24 01/10/25  Constant, Peggy, MD  fluconazole  (DIFLUCAN ) 150 MG tablet Take 1 tablet (150 mg total) by mouth every 3 (three) days. 12/10/23   Nicholaus Almarie HERO, MD  metroNIDAZOLE  (FLAGYL ) 500 MG tablet Take 1 tablet (500 mg total) by mouth 2 (two) times daily. 12/01/23   Erik Kieth BROCKS, MD  naproxen  (NAPROSYN ) 500 MG tablet Take 1 tablet (500 mg total) by mouth 2 (two) times daily with a meal. 12/17/23   Von Reasoner, MD      Allergies    Patient has no known allergies.    Review of Systems   Review of Systems A 10 point review of systems was performed and is negative unless otherwise reported in HPI.  Physical Exam Updated Vital Signs BP (!) 172/101   Pulse 83   Temp 98 F (36.7 C)   Resp 16   SpO2 99%  Physical Exam General: Uncomfortable appearing female, lying in  bed.  HEENT: PERRLA, Sclera anicteric, MMM, trachea midline.  Cardiology: RRR, no murmurs/rubs/gallops.  Resp: Normal respiratory rate and effort. CTAB, no wheezes, rhonchi, crackles.  Abd: Soft, +LLQ and L adnexal TTP, non-distended. No rebound tenderness or guarding.  GU: Deferred. MSK: No peripheral edema or signs of trauma. Extremities without deformity or TTP.  Skin: warm, dry.  Back: No CVA tenderness Neuro: A&Ox4, CNs II-XII grossly intact. MAEs. Sensation grossly intact.  Psych: Normal mood and affect.   ED Results / Procedures / Treatments   Labs (all labs ordered are listed, but only abnormal results are displayed) Labs Reviewed  COMPREHENSIVE METABOLIC PANEL WITH GFR - Abnormal; Notable for the following components:      Result Value   Glucose, Bld 105 (*)    All other components within normal limits  URINALYSIS, ROUTINE W REFLEX MICROSCOPIC - Abnormal; Notable for the following components:   APPearance HAZY (*)    All other components within normal limits  LIPASE, BLOOD  CBC  HCG, SERUM, QUALITATIVE    EKG None  Radiology No results found.  Procedures Procedures  {Document cardiac monitor, telemetry assessment procedure when appropriate:1}  Medications Ordered in ED Medications - No data to display  ED Course/ Medical Decision Making/ A&P                          Medical Decision Making  Amount and/or Complexity of Data Reviewed Labs: ordered. Radiology: ordered.    This patient presents to the ED for concern of LLQ/L adnexal pain, this involves an extensive number of treatment options, and is a complaint that carries with it a high risk of complications and morbidity.  I considered the following differential and admission for this acute, potentially life threatening condition. HDS and afebrile but uncomfortable.  MDM:    For DDX for abdominal pain includes but is not limited to:  Abdominal exam without peritoneal signs. No evidence of acute abdomen  at this time. Greatest degree of c/f diverticulitis, urolithiasis, hemorrhagic ovarian cyst. Lower c/f ovarian torsion given that pain is constant and not intermittent. Lower suspicion for acute hepatobiliary disease (including acute cholecystitis or cholangitis), acute pancreatitis (neg lipase), PUD (including gastric perforation), avascular catastrophe, bowel obstruction. Pregnancy test is reassuringly negative, lower c/f ectopic pregnancy as patient has had in the past.    Clinical Course as of 03/20/24 1501  Sat Mar 20, 2024  1501 Labs CBC CMP UA Hcg unremaerkable. Will scan abdomen.  [HN]    Clinical Course User Index [HN] Franklyn Sid SAILOR, MD    Labs: I Ordered, and personally interpreted labs.  The pertinent results include:  those listed above  Imaging Studies ordered: I ordered imaging studies including CT abd pelvis  I independently visualized and interpreted imaging. I agree with the radiologist interpretation  Additional history obtained from chart review.    Reevaluation: After the interventions noted above, I reevaluated the patient and found that they have :improved  Social Determinants of Health: Lives independently  Disposition:  ***  Co morbidities that complicate the patient evaluation  Past Medical History:  Diagnosis Date   Acute appendicitis 12/06/2011   HTN (hypertension)      Medicines No orders of the defined types were placed in this encounter.   I have reviewed the patients home medicines and have made adjustments as needed  Problem List / ED Course: Problem List Items Addressed This Visit   None        {Document critical care time when appropriate:1} {Document review of labs and clinical decision tools ie heart score, Chads2Vasc2 etc:1}  {Document your independent review of radiology images, and any outside records:1} {Document your discussion with family members, caretakers, and with consultants:1} {Document social determinants of  health affecting pt's care:1} {Document your decision making why or why not admission, treatments were needed:1}  This note was created using dictation software, which may contain spelling or grammatical errors.

## 2024-03-20 NOTE — ED Triage Notes (Signed)
 Pt here with reports of LLQ abdominal pain onset last night. Denies NVD. Denies dysuria.

## 2024-04-03 ENCOUNTER — Emergency Department (HOSPITAL_COMMUNITY)
Admission: EM | Admit: 2024-04-03 | Discharge: 2024-04-03 | Disposition: A | Discharge status: Home / Self Care | Payer: Self-pay | Attending: Emergency Medicine | Admitting: Emergency Medicine

## 2024-04-03 ENCOUNTER — Encounter (HOSPITAL_COMMUNITY): Payer: Self-pay

## 2024-04-03 ENCOUNTER — Other Ambulatory Visit: Payer: Self-pay

## 2024-04-03 DIAGNOSIS — N80129 Deep endometriosis of ovary, unspecified ovary: Secondary | ICD-10-CM

## 2024-04-03 DIAGNOSIS — R102 Pelvic and perineal pain unspecified side: Secondary | ICD-10-CM

## 2024-04-03 DIAGNOSIS — I10 Essential (primary) hypertension: Secondary | ICD-10-CM | POA: Insufficient documentation

## 2024-04-03 DIAGNOSIS — D72829 Elevated white blood cell count, unspecified: Secondary | ICD-10-CM | POA: Insufficient documentation

## 2024-04-03 DIAGNOSIS — Z79899 Other long term (current) drug therapy: Secondary | ICD-10-CM | POA: Insufficient documentation

## 2024-04-03 LAB — URINALYSIS, ROUTINE W REFLEX MICROSCOPIC
Bacteria, UA: NONE SEEN
Bilirubin Urine: NEGATIVE
Glucose, UA: NEGATIVE mg/dL
Ketones, ur: NEGATIVE mg/dL
Leukocytes,Ua: NEGATIVE
Nitrite: NEGATIVE
Protein, ur: NEGATIVE mg/dL
Specific Gravity, Urine: 1.028 (ref 1.005–1.030)
pH: 5 (ref 5.0–8.0)

## 2024-04-03 LAB — COMPREHENSIVE METABOLIC PANEL WITH GFR
ALT: 15 U/L (ref 0–44)
AST: 14 U/L — ABNORMAL LOW (ref 15–41)
Albumin: 3.8 g/dL (ref 3.5–5.0)
Alkaline Phosphatase: 57 U/L (ref 38–126)
Anion gap: 9 (ref 5–15)
BUN: 13 mg/dL (ref 6–20)
CO2: 19 mmol/L — ABNORMAL LOW (ref 22–32)
Calcium: 8.8 mg/dL — ABNORMAL LOW (ref 8.9–10.3)
Chloride: 108 mmol/L (ref 98–111)
Creatinine, Ser: 0.79 mg/dL (ref 0.44–1.00)
GFR, Estimated: >60 mL/min (ref >60)
Glucose, Bld: 109 mg/dL — ABNORMAL HIGH (ref 70–99)
Potassium: 3.9 mmol/L (ref 3.5–5.1)
Sodium: 136 mmol/L (ref 135–145)
Total Bilirubin: 0.5 mg/dL (ref 0.0–1.2)
Total Protein: 7.2 g/dL (ref 6.5–8.1)

## 2024-04-03 LAB — LIPASE, BLOOD: Lipase: 29 U/L (ref 11–51)

## 2024-04-03 LAB — CBC WITH DIFFERENTIAL/PLATELET
Abs Immature Granulocytes: 0.08 K/uL — ABNORMAL HIGH (ref 0.00–0.07)
Basophils Absolute: 0.1 K/uL (ref 0.0–0.1)
Basophils Relative: 1 %
Eosinophils Absolute: 0.3 K/uL (ref 0.0–0.5)
Eosinophils Relative: 2 %
HCT: 39.1 % (ref 36.0–46.0)
Hemoglobin: 12.4 g/dL (ref 12.0–15.0)
Immature Granulocytes: 1 %
Lymphocytes Relative: 33 %
Lymphs Abs: 3.6 K/uL (ref 0.7–4.0)
MCH: 27 pg (ref 26.0–34.0)
MCHC: 31.7 g/dL (ref 30.0–36.0)
MCV: 85.2 fL (ref 80.0–100.0)
Monocytes Absolute: 0.7 K/uL (ref 0.1–1.0)
Monocytes Relative: 6 %
Neutro Abs: 6.3 K/uL (ref 1.7–7.7)
Neutrophils Relative %: 57 %
Platelets: 361 K/uL (ref 150–400)
RBC: 4.59 MIL/uL (ref 3.87–5.11)
RDW: 14.6 % (ref 11.5–15.5)
WBC: 11 K/uL — ABNORMAL HIGH (ref 4.0–10.5)
nRBC: 0 % (ref 0.0–0.2)

## 2024-04-03 LAB — HCG, SERUM, QUALITATIVE: Preg, Serum: NEGATIVE

## 2024-04-03 MED ORDER — OXYCODONE HCL 5 MG PO TABS: 5.0000 mg | ORAL_TABLET | Freq: Four times a day (QID) | ORAL | 0 refills | Status: DC | PRN

## 2024-04-03 MED ORDER — OXYCODONE-ACETAMINOPHEN 5-325 MG PO TABS
1.0000 | ORAL_TABLET | Freq: Once | ORAL | Status: AC
  Administered 2024-04-03: 1 via ORAL
  Filled 2024-04-03: qty 1

## 2024-04-03 MED ORDER — KETOROLAC TROMETHAMINE 10 MG PO TABS: 10.0000 mg | ORAL_TABLET | Freq: Four times a day (QID) | ORAL | 0 refills | Status: DC | PRN

## 2024-04-03 MED ORDER — KETOROLAC TROMETHAMINE 15 MG/ML IJ SOLN
15.0000 mg | Freq: Once | INTRAMUSCULAR | Status: AC
  Administered 2024-04-03: 15 mg via INTRAVENOUS
  Filled 2024-04-03: qty 1

## 2024-04-03 NOTE — ED Triage Notes (Addendum)
 Pt with LLQ for past 2 weeks, was seen here in October for same. Said she was taking Oxycodone  for pain and pain went away but since she is not taking that any longer pain is back.

## 2024-04-03 NOTE — ED Provider Notes (Signed)
 [place] EMERGENCY DEPARTMENT AT [facility] Provider Note   CSN: [id number] Arrival date & time: 04/03/24  1359     Patient presents with: Abdominal Pain   [name] is a 43 y.o. female.   Patient is a 43 year old female who presents with pain in her left lower abdomen.  She was seen here in the ED on October 25.  She had imaging including a CT abdomen pelvis as well as a pelvic ultrasound which showed a left adnexal mass concerning for a complex hemorrhagic cyst versus a endometrioma.  OB/GYN was consulted.  It was recommended that she start a progesterone only OCP which she is already taking as well as oral Toradol .  She said that the Toradol  did help the pain but she is out of the medication now and she is having ongoing pain to her left lower abdomen.  No fevers.  No nausea or vomiting.  No urinary symptoms.  She had called to get follow-up with the OB/GYN that she was referred to but they are not able to see her until December so she has not yet made an appointment.       Prior to Admission medications   Medication Sig Start Date End Date Taking? Authorizing Provider  amLODipine  (NORVASC ) 10 MG tablet Take 1 tablet (10 mg total) by mouth at bedtime. 12/29/23   Alba Sharper, MD  Drospirenone  (SLYND ) 4 MG TABS Take 1 tablet (4 mg total) by mouth daily. 01/12/24 01/10/25  Constant, Peggy, MD  fluconazole  (DIFLUCAN ) 150 MG tablet Take 1 tablet (150 mg total) by mouth every 3 (three) days. 12/10/23   Nicholaus Almarie HERO, MD  ketorolac  (TORADOL ) 10 MG tablet Take 1 tablet (10 mg total) by mouth every 6 (six) hours as needed. 04/03/24   Lenor Hollering, MD  metroNIDAZOLE  (FLAGYL ) 500 MG tablet Take 1 tablet (500 mg total) by mouth 2 (two) times daily. 12/01/23   Erik Kieth BROCKS, MD  oxyCODONE  (ROXICODONE ) 5 MG immediate release tablet Take 1 tablet (5 mg total) by mouth every 6 (six) hours as needed for up to 18 doses for severe pain (pain score 7-10). 04/03/24   Lenor Hollering, MD    Allergies: Patient has no known allergies.    Review of Systems  Constitutional:  Negative for chills, diaphoresis, fatigue and fever.  HENT:  Negative for congestion, rhinorrhea and sneezing.   Eyes: Negative.   Respiratory:  Negative for cough, chest tightness and shortness of breath.   Cardiovascular:  Negative for chest pain and leg swelling.  Gastrointestinal:  Positive for abdominal pain. Negative for diarrhea, nausea and vomiting.  Genitourinary:  Negative for difficulty urinating, flank pain and frequency.  Musculoskeletal:  Negative for arthralgias and back pain.  Skin:  Negative for rash.  Neurological:  Negative for dizziness, speech difficulty, weakness, numbness and headaches.    Updated Vital Signs BP (!) 153/97 (BP Location: Right Arm)   Pulse 69   Temp 98.4 F (36.9 C) (Oral)   Resp 20   Ht 5' 4 (1.626 m)   Wt 93 kg   LMP 03/03/2024 (Approximate)   SpO2 100%   BMI 35.19 kg/m   Physical Exam Constitutional:      Appearance: She is well-developed.  HENT:     Head: Normocephalic and atraumatic.  Eyes:     Pupils: Pupils are equal, round, and reactive to light.  Cardiovascular:     Rate and Rhythm: Normal rate and regular rhythm.  Heart sounds: Normal heart sounds.  Pulmonary:     Effort: Pulmonary effort is normal. No respiratory distress.     Breath sounds: Normal breath sounds. No wheezing or rales.  Chest:     Chest wall: No tenderness.  Abdominal:     General: Bowel sounds are normal.     Palpations: Abdomen is soft.     Tenderness: There is abdominal tenderness in the left lower quadrant. There is no guarding or rebound.  Musculoskeletal:        General: Normal range of motion.     Cervical back: Normal range of motion and neck supple.  Lymphadenopathy:     Cervical: No cervical adenopathy.  Skin:    General: Skin is warm and dry.     Findings: No rash.  Neurological:     Mental Status: She is alert and oriented to  person, place, and time.     (all labs ordered are listed, but only abnormal results are displayed) Labs Reviewed  COMPREHENSIVE METABOLIC PANEL WITH GFR - Abnormal; Notable for the following components:      Result Value   CO2 19 (*)    Glucose, Bld 109 (*)    Calcium 8.8 (*)    AST 14 (*)    All other components within normal limits  CBC WITH DIFFERENTIAL/PLATELET - Abnormal; Notable for the following components:   WBC 11.0 (*)    Abs Immature Granulocytes 0.08 (*)    All other components within normal limits  URINALYSIS, ROUTINE W REFLEX MICROSCOPIC - Abnormal; Notable for the following components:   APPearance HAZY (*)    Hgb urine dipstick SMALL (*)    All other components within normal limits  LIPASE, BLOOD  HCG, SERUM, QUALITATIVE    EKG: None  Radiology: No results found.   Procedures   Medications Ordered in the ED  oxyCODONE -acetaminophen  (PERCOCET/ROXICET) 5-325 MG per tablet 1 tablet (1 tablet Oral Given 04/03/24 1432)  ketorolac  (TORADOL ) 15 MG/ML injection 15 mg (15 mg Intravenous Given 04/03/24 1510)                                    Medical Decision Making Risk Prescription drug management.   This patient presents to the ED for concern of lower abdominal pain, this involves an extensive number of treatment options, and is a complaint that carries with it a high risk of complications and morbidity.  I considered the following differential and admission for this acute, potentially life threatening condition.  The differential diagnosis includes ovarian cyst, ovarian tumor, tubo-ovarian abscess, ovarian torsion, diverticulitis, UTI, kidney stone  MDM:    Patient is a 43 year old who presents with pain in her left lower abdomen.  She was seen here recently and had a CT scan as well as an ultrasound which showed a cyst on her left ovary as well as a complex cyst versus endometrioma on her right ovary.  Her pain is primarily on her left side.  She said that  pain had done okay with the pain medication she was given but when she ran out of the pain medication her pain has returned.  She was on Toradol  as well as oxycodone  for pain.  She is afebrile.  She was given dose of Toradol  in the ED and is feeling better after this.  She still has some pain but it is improved.  Labs reviewed and are nonconcerning.  Her WBC count is mildly elevated.  Discussed with the OB/GYN on-call, Dr. Cleatus.  She reviewed the images and does agree that it looks more concerning on the right for an endometrioma which sometimes can cause left side ovarian pain but she also may just have a cyst on the left side which is causing the pain.  She did not feel that a repeat ultrasound was indicated.  She will send a message to the office to see if they can get the patient in sooner.  She was discharged home in good condition.  I did give her a refill on her pain medications.  Return precautions were given.  (Labs, imaging, consults)  Labs: I Ordered, and personally interpreted labs.  The pertinent results include: Mildly elevated WBC count, negative pregnancy test  Imaging Studies ordered: I ordered imaging studies including none I did review the images from October 25   additional history obtained from significant other at bedside.  External records from outside source obtained and reviewed including history  Cardiac Monitoring: The patient was not maintained on a cardiac monitor.  If on the cardiac monitor, I personally viewed and interpreted the cardiac monitored which showed an underlying rhythm of:    Reevaluation: After the interventions noted above, I reevaluated the patient and found that they have :improved  Social Determinants of Health:    Disposition: Discharged to home  Co morbidities that complicate the patient evaluation  Past Medical History:  Diagnosis Date   Acute appendicitis 12/06/2011   HTN (hypertension)      Medicines Meds ordered this encounter   Medications   oxyCODONE -acetaminophen  (PERCOCET/ROXICET) 5-325 MG per tablet 1 tablet    Refill:  0   ketorolac  (TORADOL ) 15 MG/ML injection 15 mg   ketorolac  (TORADOL ) 10 MG tablet    Sig: Take 1 tablet (10 mg total) by mouth every 6 (six) hours as needed.    Dispense:  20 tablet    Refill:  0   oxyCODONE  (ROXICODONE ) 5 MG immediate release tablet    Sig: Take 1 tablet (5 mg total) by mouth every 6 (six) hours as needed for up to 18 doses for severe pain (pain score 7-10).    Dispense:  12 tablet    Refill:  0    I have reviewed the patients home medicines and have made adjustments as needed  Problem List / ED Course: Problem List Items Addressed This Visit   None Visit Diagnoses       Pelvic pain    -  Primary                Final diagnoses:  Pelvic pain    ED Discharge Orders          Ordered    ketorolac  (TORADOL ) 10 MG tablet  Every 6 hours PRN        04/03/24 1617    oxyCODONE  (ROXICODONE ) 5 MG immediate release tablet  Every 6 hours PRN        04/03/24 1617               Lenor Hollering, MD 04/03/24 1626

## 2024-04-03 NOTE — ED Notes (Signed)
 Patient Alert and oriented to baseline. Stable and ambulatory to baseline. Patient verbalized understanding of the discharge instructions.  Patient belongings were taken by the patient.

## 2024-04-03 NOTE — ED Provider Triage Note (Signed)
 Emergency Medicine Provider Triage Evaluation Note  [name] , a 43 y.o. female  was evaluated in triage.  Pt complains of LLQ abdominal. Feels exactly the same as seen in October, had work-up that showed endometrioma, was told to follow-up with Gyn, has an appointment in December.   Review of Systems  Positive:  Negative:   Physical Exam  BP (!) 153/97 (BP Location: Right Arm)   Pulse 86   Temp 98.4 F (36.9 C) (Oral)   Resp 20   Ht 5' 4 (1.626 m)   Wt 93 kg   LMP 03/03/2024 (Approximate)   SpO2 100%   BMI 35.19 kg/m  Gen:   Awake, no distress   Resp:  Normal effort  MSK:   Moves extremities without difficulty  Other:    Medical Decision Making  Medically screening exam initiated at 2:21 PM.  Appropriate orders placed.  WALTER MIN was informed that the remainder of the evaluation will be completed by another provider, this initial triage assessment does not replace that evaluation, and the importance of remaining in the ED until their evaluation is complete.     Nora Lauraine LABOR, PA-C 04/03/24 1424

## 2024-04-03 NOTE — Progress Notes (Signed)
   OB/GYN Telephone Consult  [name] is a 43 y.o. [phone] presenting with LLQ pain in setting of right ovarian cyst.   I was called for a consult regarding the care of this patient by [facility].    The provider had a clinical question regarding follow up for this patient.    The provider presented the following relevant clinical information and I performed a chart review on the patient and reviewed available documentation:  Patient presented in October with LLQ pain and had an US  at that time showing a right ovarian endometrioma vs hemorrhagic cyst. GYN consultation encouraged outpatient follow up with toradol  PO.   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 Otherwise the left does not have obvious pathology, but reviewing their report, it recommends pelvic MRI to further evaluate. Uterus pathology is noncontributory but she has a small 1 cm fibroid that intramural.   BP (!) 153/97 (BP Location: Right Arm)   Pulse 69   Temp 98.4 F (36.9 C) (Oral)   Resp 20   Ht 5' 4 (1.626 m)   Wt 93 kg   LMP 03/03/2024 (Approximate)   SpO2 100%   BMI 35.19 kg/m   Exam- performed by consulting provider   Recommendations:  - I am more suspicious of endometrioma than hemorrhagic cyst.  - Pain is not different than before but she ran out of pain medication.  - Message sent to Dr. Ritchie office for possible follow up. - Patient does not have insurance and will need to apply for financial assistance. I informed office of this as well.   -Recommended MD/APP provide the patient with a referral to the Center for [facility] (any office) for follow up in  1 week.   Thank you for this consult and if additional recommendations are needed please call [phone] for the OB/GYN attending on service at [facility].   I spent approximately 5 minutes directly  consulting with the provider and verbally discussing this case. Additionally 15 minutes minutes was spent performing chart review and documentation.   Vina Solian, MD Attending Obstetrician & Gynecologist, [facility] for [facility], [facility]

## 2024-04-03 NOTE — Discharge Instructions (Addendum)
 Call again to have close follow-up with the OB/GYN group.  Return to the emergency room if you have any worsening symptoms.

## 2024-04-07 ENCOUNTER — Emergency Department (HOSPITAL_COMMUNITY)
Admission: EM | Admit: 2024-04-07 | Discharge: 2024-04-07 | Disposition: A | Discharge status: Home / Self Care | Payer: Self-pay | Attending: Emergency Medicine | Admitting: Emergency Medicine

## 2024-04-07 ENCOUNTER — Encounter (HOSPITAL_COMMUNITY): Payer: Self-pay

## 2024-04-07 ENCOUNTER — Emergency Department (HOSPITAL_COMMUNITY): Payer: Self-pay

## 2024-04-07 ENCOUNTER — Other Ambulatory Visit: Payer: Self-pay

## 2024-04-07 DIAGNOSIS — I10 Essential (primary) hypertension: Secondary | ICD-10-CM | POA: Diagnosis not present

## 2024-04-07 DIAGNOSIS — R102 Pelvic and perineal pain unspecified side: Secondary | ICD-10-CM | POA: Diagnosis not present

## 2024-04-07 DIAGNOSIS — Z79899 Other long term (current) drug therapy: Secondary | ICD-10-CM | POA: Insufficient documentation

## 2024-04-07 DIAGNOSIS — R1030 Lower abdominal pain, unspecified: Secondary | ICD-10-CM | POA: Diagnosis present

## 2024-04-07 DIAGNOSIS — N760 Acute vaginitis: Secondary | ICD-10-CM | POA: Insufficient documentation

## 2024-04-07 DIAGNOSIS — B9689 Other specified bacterial agents as the cause of diseases classified elsewhere: Secondary | ICD-10-CM

## 2024-04-07 LAB — COMPREHENSIVE METABOLIC PANEL WITH GFR
ALT: 14 U/L (ref 0–44)
AST: 15 U/L (ref 15–41)
Albumin: 4.2 g/dL (ref 3.5–5.0)
Alkaline Phosphatase: 68 U/L (ref 38–126)
Anion gap: 9 (ref 5–15)
BUN: 11 mg/dL (ref 6–20)
CO2: 23 mmol/L (ref 22–32)
Calcium: 9.1 mg/dL (ref 8.9–10.3)
Chloride: 107 mmol/L (ref 98–111)
Creatinine, Ser: 0.73 mg/dL (ref 0.44–1.00)
GFR, Estimated: >60 mL/min (ref >60)
Glucose, Bld: 102 mg/dL — ABNORMAL HIGH (ref 70–99)
Potassium: 3.7 mmol/L (ref 3.5–5.1)
Sodium: 139 mmol/L (ref 135–145)
Total Bilirubin: 0.2 mg/dL (ref 0.0–1.2)
Total Protein: 7.3 g/dL (ref 6.5–8.1)

## 2024-04-07 LAB — CBC
HCT: 38.7 % (ref 36.0–46.0)
Hemoglobin: 12.3 g/dL (ref 12.0–15.0)
MCH: 27 pg (ref 26.0–34.0)
MCHC: 31.8 g/dL (ref 30.0–36.0)
MCV: 85.1 fL (ref 80.0–100.0)
Platelets: 348 K/uL (ref 150–400)
RBC: 4.55 MIL/uL (ref 3.87–5.11)
RDW: 14.4 % (ref 11.5–15.5)
WBC: 9.8 K/uL (ref 4.0–10.5)
nRBC: 0 % (ref 0.0–0.2)

## 2024-04-07 LAB — LIPASE, BLOOD: Lipase: 29 U/L (ref 11–51)

## 2024-04-07 LAB — URINALYSIS, ROUTINE W REFLEX MICROSCOPIC
Bacteria, UA: NONE SEEN
Bilirubin Urine: NEGATIVE
Glucose, UA: NEGATIVE mg/dL
Ketones, ur: NEGATIVE mg/dL
Leukocytes,Ua: NEGATIVE
Nitrite: NEGATIVE
Protein, ur: NEGATIVE mg/dL
Specific Gravity, Urine: 1.035 — ABNORMAL HIGH (ref 1.005–1.030)
pH: 7 (ref 5.0–8.0)

## 2024-04-07 LAB — GC/CHLAMYDIA PROBE AMP (~~LOC~~) NOT AT ARMC
Chlamydia: NEGATIVE
Comment: NEGATIVE
Comment: NORMAL
Neisseria Gonorrhea: NEGATIVE

## 2024-04-07 LAB — WET PREP, GENITAL
Sperm: NONE SEEN
Trich, Wet Prep: NONE SEEN
WBC, Wet Prep HPF POC: <10 (ref <10)
Yeast Wet Prep HPF POC: NONE SEEN

## 2024-04-07 LAB — HCG, SERUM, QUALITATIVE: Preg, Serum: NEGATIVE

## 2024-04-07 MED ORDER — MORPHINE SULFATE (PF) 4 MG/ML IV SOLN
4.0000 mg | Freq: Once | INTRAVENOUS | Status: AC
  Administered 2024-04-07: 4 mg via INTRAVENOUS
  Filled 2024-04-07: qty 1

## 2024-04-07 MED ORDER — KETOROLAC TROMETHAMINE 15 MG/ML IJ SOLN
15.0000 mg | Freq: Once | INTRAMUSCULAR | Status: AC
  Administered 2024-04-07: 15 mg via INTRAVENOUS
  Filled 2024-04-07: qty 1

## 2024-04-07 MED ORDER — HYDROMORPHONE HCL 1 MG/ML IJ SOLN
1.0000 mg | Freq: Once | INTRAMUSCULAR | Status: AC
  Administered 2024-04-07: 1 mg via INTRAVENOUS
  Filled 2024-04-07: qty 1

## 2024-04-07 MED ORDER — IOHEXOL 300 MG/ML  SOLN
100.0000 mL | Freq: Once | INTRAMUSCULAR | Status: AC | PRN
  Administered 2024-04-07: 100 mL via INTRAVENOUS

## 2024-04-07 MED ORDER — METRONIDAZOLE 500 MG PO TABS: 500.0000 mg | ORAL_TABLET | Freq: Two times a day (BID) | ORAL | 0 refills | Status: DC

## 2024-04-07 MED ORDER — KETOROLAC TROMETHAMINE 10 MG PO TABS: 10.0000 mg | ORAL_TABLET | Freq: Four times a day (QID) | ORAL | 0 refills | Status: DC | PRN

## 2024-04-07 MED ORDER — OXYCODONE HCL 5 MG PO TABS: 5.0000 mg | ORAL_TABLET | ORAL | 0 refills | Status: AC | PRN

## 2024-04-07 MED ORDER — ONDANSETRON HCL 4 MG/2ML IJ SOLN
4.0000 mg | Freq: Once | INTRAMUSCULAR | Status: AC
  Administered 2024-04-07: 4 mg via INTRAVENOUS
  Filled 2024-04-07: qty 2

## 2024-04-07 NOTE — ED Provider Notes (Signed)
 [name] EMERGENCY DEPARTMENT AT [facility] Provider Note   CSN: [id number] Arrival date & time: 04/07/24  0037     Patient presents with: Abdominal Pain   Savannah [name] is [name] 43 y.o. female.  Patient with past history significant for endometrioma, ectopic pregnancy, laparoscopic appendectomy, hypertension presents to the emergency department concerns abdominal pain.  States that she began to have worsening severe lower abdominal pain with vaginal bleeding last 2 hours.  States she has [name] history of endometrioma and ovarian cysts and has an appointment scheduled to follow-up with her OB/GYN in December.  States that she is out of the pain medications that she had been prescribed.  Has been seen twice in the last several weeks for similar pain with prior imaging showing concerns for possible endometrioma versus complex ovarian cyst versus hemorrhagic cyst.   Abdominal Pain      Prior to Admission medications   Medication Sig Start Date End Date Taking? Authorizing Provider  amLODipine  (NORVASC ) 10 MG tablet Take 1 tablet (10 mg total) by mouth at bedtime. 12/29/23  Yes Savannah Sharper, MD  Drospirenone  (SLYND ) 4 MG TABS Take 1 tablet (4 mg total) by mouth daily. 01/12/24 01/10/25 Yes Savannah [name], Peggy, MD  metroNIDAZOLE  (FLAGYL ) 500 MG tablet Take 1 tablet (500 mg total) by mouth 2 (two) times daily for 7 days. 04/07/24 04/14/24 Yes Savannah [name], Savannah [name]  oxyCODONE  (ROXICODONE ) 5 MG immediate release tablet Take 1 tablet (5 mg total) by mouth every 4 (four) hours as needed for up to 3 days for severe pain (pain score 7-10). 04/07/24 04/10/24 Yes Savannah [name], Savannah [name]  fluconazole  (DIFLUCAN ) 150 MG tablet Take 1 tablet (150 mg total) by mouth every 3 (three) days. Patient not taking: Reported on 04/07/2024 12/10/23   Savannah Almarie HERO, MD  ketorolac  (TORADOL ) 10 MG tablet Take 1 tablet (10 mg total) by mouth every 6 (six) hours as needed. 04/07/24   Savannah [name], Savannah [name]     Allergies: Patient has no known allergies.    Review of Systems  Gastrointestinal:  Positive for abdominal pain.  All other systems reviewed and are negative.   Updated Vital Signs BP (!) 177/105 (BP Location: Right Arm)   Pulse 74   Temp 98.1 F (36.7 C) (Oral)   Resp 14   Ht 5' 4 (1.626 m)   Wt 93 kg   LMP 03/03/2024 (Approximate) Comment: negative HCG 04/07/24  SpO2 100%   BMI 35.19 kg/m   Physical Exam Vitals and nursing note reviewed. Exam conducted with [name] chaperone present.  Constitutional:      General: She is not in acute distress.    Appearance: She is well-developed.  HENT:     Head: Normocephalic and atraumatic.  Eyes:     Conjunctiva/sclera: Conjunctivae normal.  Cardiovascular:     Rate and Rhythm: Normal rate and regular rhythm.     Heart sounds: No murmur heard. Pulmonary:     Effort: Pulmonary effort is normal. No respiratory distress.     Breath sounds: Normal breath sounds.  Abdominal:     Palpations: Abdomen is soft.     Tenderness: There is abdominal tenderness in the right lower quadrant, suprapubic area and left lower quadrant. There is guarding.  Genitourinary:    Pubic Area: No rash.      Vagina: Normal.     Cervix: Dilated. Cervical motion tenderness and cervical bleeding present. No discharge or lesion.     Adnexa:  Right: Tenderness present.        Left: Tenderness present.   Musculoskeletal:        General: No swelling.     Cervical back: Neck supple.  Skin:    General: Skin is warm and dry.     Capillary Refill: Capillary refill takes less than 2 seconds.  Neurological:     Mental Status: She is alert.  Psychiatric:        Mood and Affect: Mood normal.     (all labs ordered are listed, but only abnormal results are displayed) Labs Reviewed  WET PREP, GENITAL - Abnormal; Notable for the following components:      Result Value   Clue Cells Wet Prep HPF POC PRESENT (*)    All other components within normal limits   COMPREHENSIVE METABOLIC PANEL WITH GFR - Abnormal; Notable for the following components:   Glucose, Bld 102 (*)    All other components within normal limits  URINALYSIS, ROUTINE W REFLEX MICROSCOPIC - Abnormal; Notable for the following components:   Color, Urine STRAW (*)    Specific Gravity, Urine 1.035 (*)    Hgb urine dipstick MODERATE (*)    All other components within normal limits  LIPASE, BLOOD  CBC  HCG, SERUM, QUALITATIVE  GC/CHLAMYDIA PROBE AMP ([name]) NOT AT [facility]    EKG: None  Radiology: CT ABDOMEN PELVIS W CONTRAST Result Date: 04/07/2024 EXAM: CT ABDOMEN AND PELVIS WITH CONTRAST 04/07/2024 02:20:52 AM TECHNIQUE: CT of the abdomen and pelvis was performed with the administration of 100 mL of iohexol  (OMNIPAQUE ) 300 MG/ML solution. Multiplanar reformatted images are provided for review. Automated exposure control, iterative reconstruction, and/or weight-based adjustment of the mA/kV was utilized to reduce the radiation dose to as low as reasonably achievable. COMPARISON: 03/20/2024 CLINICAL HISTORY: Left lower quadrant pain. FINDINGS: LOWER CHEST: Lung bases are free from acute inflammatory changes and sizeable effusion. LIVER: [name] hypodense lesion is noted in the dome of the liver, stable from the prior exam. GALLBLADDER AND BILE DUCTS: Gallbladder is within normal limits. No biliary ductal dilatation. SPLEEN: Spleen is unremarkable. PANCREAS: Pancreas is unremarkable. ADRENAL GLANDS: Adrenal glands are within normal limits. KIDNEYS, URETERS AND BLADDER: Kidneys demonstrate [name] normal enhancement pattern bilaterally. No renal calculi. No obstructive changes are seen. The bladder is well distended. GI AND BOWEL: Stomach demonstrates no acute abnormality. The small bowel is unremarkable. No obstructive or inflammatory changes of the colon are seen. The appendix has been surgically removed. PERITONEUM AND RETROPERITONEUM: No free fluid is noted. No free air. VASCULATURE: The aorta  is within normal limits. LYMPH NODES: No lymphadenopathy is noted. REPRODUCTIVE ORGANS: Uterus is within normal limits. [name] stable right adnexal cyst is seen. BONES AND SOFT TISSUES: No bony abnormalities noted. IMPRESSION: 1. No acute findings in the abdomen or pelvis. No change from the prior exam. Electronically signed by: Savannah Devonshire MD 04/07/2024 02:31 AM EST RP Workstation: [id number]     Procedures   Medications Ordered in the ED  morphine  (PF) 4 MG/ML injection 4 mg (4 mg Intravenous Given 04/07/24 0105)  ketorolac  (TORADOL ) 15 MG/ML injection 15 mg (15 mg Intravenous Given 04/07/24 0105)  ondansetron  (ZOFRAN ) injection 4 mg (4 mg Intravenous Given 04/07/24 0105)  iohexol  (OMNIPAQUE ) 300 MG/ML solution 100 mL (100 mLs Intravenous Contrast Given 04/07/24 0156)  morphine  (PF) 4 MG/ML injection 4 mg (4 mg Intravenous Given 04/07/24 0214)  HYDROmorphone  (DILAUDID ) injection 1 mg (1 mg Intravenous Given 04/07/24 0402)  Medical Decision Making Amount and/or Complexity of Data Reviewed Labs: ordered. Radiology: ordered.  Risk Prescription drug management.   This patient presents to the ED for concern of abdominal pain, this involves an extensive number of treatment options, and is [name] complaint that carries with it [name] high risk of complications and morbidity.  The differential diagnosis includes bowel obstruction, diverticulitis, endometritis, tubo-ovarian abscess, ovarian torsion   Co morbidities that complicate the patient evaluation  History of ectopic pregnancy, laparoscopic appendectomy, hypertension   Additional history obtained:  Additional history obtained from chart review   Lab Tests:  I Ordered, and personally interpreted labs.  The pertinent results include: CBC unremarkable   Imaging Studies ordered:  I ordered imaging studies including CT abdomen pelvis I independently visualized and interpreted imaging which showed no acute  findings on CT imaging, stable from prior. I agree with the radiologist interpretation   Consultations Obtained:  I requested consultation with none,  and discussed lab and imaging findings as well as pertinent plan - they recommend: N/[name]   Problem List / ED Course / Critical interventions / Medication management  Patient presents to the emergency department today with concerns of abdominal pain.  States that she been having severe lower abdominal pain and vaginal bleeding that started about 2 hours ago.  Has [name] history of endometrioma and has been seen in the Emergency Department 2 other times last several weeks.  She reports that she has [name] follow-up visit with her OB/GYN scheduled for December.  She is concerned that she is unable to manage her pain well at this time.  She is currently out of her home Percocet and Toradol .  Denies any fever, chills, body aches. Physical exam is concerning for lower abdominal tenderness.  Chaperoned pelvic exam reveals bloody discharge from the cervical os with no appreciable dilation of the os.  There is no mucopurulent drainage or discharge.  Cervical motion tenderness is observed and bilateral adnexal tenderness. Patient's workup is reassuring with no evident leukocytosis, UA inconsistent with infection, hCG negative, and CMP unremarkable.  Patient's wet prep is notably positive for clue cells concerning for bacterial vaginosis but no trichomonas is present.  She has low concerns for STIs and gonorrhea chlamydia is currently pending. CT abdomen pelvis is negative for any acute findings.  Patient received multiple rounds of pain medications including morphine  x 2, Toradol , Dilaudid .  Pain was improved with this combination medications.  Feel the patient is otherwise stable for outpatient follow-up and will likely require sooner assessment by OB/GYN.  Return precautions discussed such as concerns for worsening symptoms.  Refill of patient's pain medication sent to  pharmacy for short 3 to 5-day supply.  Discharged home in stable condition. I ordered medication including morphine , Toradol , Dilaudid , Zofran  for pain, nausea Reevaluation of the patient after these medicines showed that the patient improved I have reviewed the patients home medicines and have made adjustments as needed   Social Determinants of Health:  None   Test / Admission - Considered:  Admission considered but stable for outpatient follow-up.  Final diagnoses:  Pelvic pain  Bacterial vaginosis    ED Discharge Orders          Ordered    metroNIDAZOLE  (FLAGYL ) 500 MG tablet  2 times daily        04/07/24 0521    oxyCODONE  (ROXICODONE ) 5 MG immediate release tablet  Every 4 hours PRN        04/07/24 0526    ketorolac  (TORADOL )  10 MG tablet  Every 6 hours PRN        04/07/24 0618               Tekelia Kareem [name], Savannah [name] 04/07/24 [id number]    Trine Raynell Moder, MD 04/08/24 [phone]

## 2024-04-07 NOTE — ED Notes (Signed)
 Pt seems to be more comfortable, pt HR was decreased along with her BP. Pelvic exam was preformed and labs were sent off for same.

## 2024-04-07 NOTE — ED Notes (Signed)
 Pt wished to speak with PA, she stated she did not believe she needed to be discharged. I did speak with PA and he stated he would go back in and speak with her. Her AVS was reviewed with her and IV was removed as well.

## 2024-04-07 NOTE — ED Notes (Signed)
 Patient transported to CT

## 2024-04-07 NOTE — Discharge Instructions (Addendum)
 You were seen in the ER today for concerns of abdominal pain. Your repeat CT scan appears stable and your labs were mostly reassuring. I do think you need close follow up with your OBGYN and would recommend reaching out to their office to try to have your appointment moved up if possible. You were found to have bacterial vagnosis on your labs and I have started you on treatment for this. If you have significant changes or worsening in symptoms, I would suggest you come back into the ER for evaluation.

## 2024-04-07 NOTE — ED Triage Notes (Signed)
 Pt POV d/t severe lower ABD pain with vaginal bleeding for last 2 hours.  Pt has hx of Endometriosis and ovarian cyst.

## 2024-04-11 ENCOUNTER — Encounter (HOSPITAL_COMMUNITY): Payer: Self-pay

## 2024-04-11 ENCOUNTER — Ambulatory Visit (HOSPITAL_COMMUNITY)
Admission: EM | Admit: 2024-04-11 | Discharge: 2024-04-11 | Disposition: A | Discharge status: Home / Self Care | Attending: Physician Assistant | Admitting: Physician Assistant

## 2024-04-11 DIAGNOSIS — R1030 Lower abdominal pain, unspecified: Secondary | ICD-10-CM | POA: Diagnosis not present

## 2024-04-11 DIAGNOSIS — B3731 Acute candidiasis of vulva and vagina: Secondary | ICD-10-CM | POA: Diagnosis present

## 2024-04-11 DIAGNOSIS — R102 Pelvic and perineal pain unspecified side: Secondary | ICD-10-CM | POA: Insufficient documentation

## 2024-04-11 DIAGNOSIS — N898 Other specified noninflammatory disorders of vagina: Secondary | ICD-10-CM | POA: Insufficient documentation

## 2024-04-11 LAB — POCT URINE DIPSTICK
Glucose, UA: NEGATIVE mg/dL
Ketones, POC UA: NEGATIVE mg/dL
Nitrite, UA: NEGATIVE
POC PROTEIN,UA: 30 — AB
Spec Grav, UA: 1.02 (ref 1.010–1.025)
Urobilinogen, UA: 0.2 U/dL
pH, UA: 6.5 (ref 5.0–8.0)

## 2024-04-11 LAB — POCT URINE PREGNANCY: Preg Test, Ur: NEGATIVE

## 2024-04-11 MED ORDER — HYDROCODONE-ACETAMINOPHEN 5-325 MG PO TABS
1.0000 | ORAL_TABLET | Freq: Once | ORAL | Status: AC
  Administered 2024-04-11: 1 via ORAL

## 2024-04-11 MED ORDER — HYDROCODONE-ACETAMINOPHEN 5-325 MG PO TABS
ORAL_TABLET | ORAL | Status: AC
  Filled 2024-04-11: qty 1

## 2024-04-11 MED ORDER — HYDROCODONE-ACETAMINOPHEN 5-325 MG PO TABS: 1.0000 | ORAL_TABLET | Freq: Two times a day (BID) | ORAL | 0 refills | Status: DC | PRN

## 2024-04-11 MED ORDER — IBUPROFEN 600 MG PO TABS: 600.0000 mg | ORAL_TABLET | Freq: Four times a day (QID) | ORAL | 0 refills | Status: DC | PRN

## 2024-04-11 MED ORDER — FLUCONAZOLE 150 MG PO TABS: 150.0000 mg | ORAL_TABLET | Freq: Once | ORAL | 0 refills | Status: AC

## 2024-04-11 NOTE — Discharge Instructions (Addendum)
 Your urine was not convincing for an infection but given you are having lower abdominal pain I am going to send this for culture and we will contact you if this is positive and changes our treatment plan.  I will contact you if any of your other testing is abnormal and we need to start additional medications.  I have called in a dose of Diflucan  as a suspect the vaginal irritation is related to yeast.  Take 1 dose of Diflucan  as soon as you get it.  As we discussed, I am not able to provide more than a few days of narcotic pain medication in urgent care and we will not be able to provide you a refill.  I have called in 6 tablets of hydrocodone .  This medication is both addictive and sedating so try to limit use as much as possible.  I have also called in ibuprofen  600 mg to be taken every 6-8 hours.  Do not combine this with NSAIDs including aspirin, ibuprofen /Advil , naproxen /Aleve .  It is very importantly follow-up with OB/GYN as soon as possible.  If anything worsens please go to the emergency room for further evaluation and management as we discussed.

## 2024-04-11 NOTE — ED Provider Notes (Signed)
 [facility]    CSN: [id number] Arrival date & time: 04/11/24  1034      History   Chief Complaint Chief Complaint  Patient presents with   Abdominal Pain    HPI [name] is a 43 y.o. female.   Patient presents today with a 1 month history of persistent and severe lower abdominal pain.  She reports that this is generalized throughout her lower abdomen but worse on the left, rated over a 10 on a 0-10 pain scale, described as sharp.  She has been seen in the emergency room 3 times since her symptoms began.  She was initially seen on 03/20/2024 at which point she had a CT abdomen pelvis that showed right adnexal cyst.  This was further characterized by an ultrasound that showed large cyst concerning for hemorrhagic cyst versus endometrioma, uterine fibroid, enlarged lobulated ovaries with recommendation for GYN referral and consideration of pelvic MRI.  She was sent home with Toradol  and oxycodone  and had improvement but not resolution of symptoms.  She returned on 04/03/2024 at which point she had a mild leukocytosis and was given a refill for Toradol  and oxycodone .  She was reevaluated again on 04/07/2024 at which point a repeat CT abdomen pelvis showed no change from previous study.  She did test positive for BV and so was started on metronidazole .  She reports that this has provided some side effects including vaginal irritation and tongue pain.  She continues to have severe pain and is not able to see her OB/GYN until 04/29/2024.  She has run out of pain medication and is requesting a refill today.  She has had nausea but denies any vomiting, fever, abnormal discharge.  She is having difficulty with her daily activities because of the severity of symptoms.    Past Medical History:  Diagnosis Date   Acute appendicitis 12/06/2011   HTN (hypertension)     Patient Active Problem List   Diagnosis Date Noted   Influenza A 05/16/2022   Orthostatic hypotension 12/12/2021    Essential hypertension 06/19/2021   COVID-19 09/15/2020   Episodic lightheadedness 09/15/2020   Eye lesion 09/15/2020   Exposure to COVID-19 virus 06/04/2019   Acute bilateral low back pain without sciatica 03/04/2019   Pruritus 02/19/2019   Tobacco use 02/19/2019   Lower abdominal pain 02/19/2019   Routine adult health maintenance 02/17/2019   Elevated BP without diagnosis of hypertension 02/17/2019   Left tubal pregnancy without intrauterine pregnancy    Ectopic pregnancy without intrauterine pregnancy     Past Surgical History:  Procedure Laterality Date   CESAREAN SECTION  2007   DIAGNOSTIC LAPAROSCOPY WITH REMOVAL OF ECTOPIC PREGNANCY N/A 05/18/2016   Procedure: DIAGNOSTIC LAPAROSCOPY WITH LEFT SALPINGECTOMY;  Surgeon: Winton Felt, MD;  Location: [facility];  Service: Gynecology;  Laterality: N/A;   DILATION AND CURETTAGE OF UTERUS     LAPAROSCOPIC APPENDECTOMY  12/05/2011   Procedure: APPENDECTOMY LAPAROSCOPIC;  Surgeon: Elspeth KYM Schultze, MD;  Location: [facility];  Service: General;  Laterality: N/A;   TUBAL LIGATION      OB History     Gravida  4   Para  2   Term  1   Preterm      AB  2   Living         SAB  1   IAB      Ectopic  1   Multiple      Live Births  Home Medications    Prior to Admission medications   Medication Sig Start Date End Date Taking? Authorizing Provider  Drospirenone  (SLYND ) 4 MG TABS Take 1 tablet (4 mg total) by mouth daily. 01/12/24 01/10/25 Yes Constant, Peggy, MD  HYDROcodone -acetaminophen  (NORCO/VICODIN) 5-325 MG tablet Take 1 tablet by mouth 2 (two) times daily as needed for up to 3 days. 04/11/24 04/14/24 Yes Malaijah Houchen K, PA-C  ibuprofen  (ADVIL ) 600 MG tablet Take 1 tablet (600 mg total) by mouth every 6 (six) hours as needed. 04/11/24  Yes Lorina Duffner K, PA-C  amLODipine  (NORVASC ) 10 MG tablet Take 1 tablet (10 mg total) by mouth at bedtime. 12/29/23   Alba Sharper, MD  fluconazole  (DIFLUCAN ) 150 MG  tablet Take 1 tablet (150 mg total) by mouth once for 1 dose. 04/11/24 04/11/24  Rory Montel, Rocky POUR, PA-C    Family History History reviewed. No pertinent family history.  Social History Social History   Tobacco Use   Smoking status: Every Day    Current packs/day: 0.50    Average packs/day: 0.5 packs/day for 8.0 years (4.0 ttl pk-yrs)    Types: Cigarettes   Smokeless tobacco: Never  Vaping Use   Vaping status: Never Used  Substance Use Topics   Alcohol use: Yes    Comment: 6 pack of beer per week   Drug use: No     Allergies   Patient has no known allergies.   Review of Systems Review of Systems  Constitutional:  Positive for activity change. Negative for appetite change, fatigue and fever.  Respiratory:  Negative for shortness of breath.   Cardiovascular:  Negative for chest pain.  Gastrointestinal:  Positive for abdominal pain and nausea. Negative for diarrhea and vomiting.  Genitourinary:  Positive for pelvic pain. Negative for dysuria, flank pain, frequency, urgency, vaginal bleeding, vaginal discharge and vaginal pain.     Physical Exam Triage Vital Signs ED Triage Vitals  Encounter Vitals Group     BP 04/11/24 1111 (!) 152/94     Girls Systolic BP Percentile --      Girls Diastolic BP Percentile --      Boys Systolic BP Percentile --      Boys Diastolic BP Percentile --      Pulse Rate 04/11/24 1111 72     Resp 04/11/24 1111 18     Temp 04/11/24 1111 98.8 F (37.1 C)     Temp Source 04/11/24 1111 Oral     SpO2 04/11/24 1111 98 %     Weight --      Height --      Head Circumference --      Peak Flow --      Pain Score 04/11/24 1109 10     Pain Loc --      Pain Education --      Exclude from Growth Chart --    No data found.  Updated Vital Signs BP (!) 152/94 (BP Location: Right Arm)   Pulse 72   Temp 98.8 F (37.1 C) (Oral)   Resp 18   LMP 03/03/2024 (Approximate) Comment: negative HCG 04/07/24  SpO2 98%   Visual Acuity Right Eye Distance:    Left Eye Distance:   Bilateral Distance:    Right Eye Near:   Left Eye Near:    Bilateral Near:     Physical Exam Vitals reviewed. Exam conducted with a chaperone present.  Constitutional:      General: She is awake. She is not in  acute distress.    Appearance: Normal appearance. She is well-developed. She is not ill-appearing.     Comments: Very pleasant female appears stated age in no acute distress sitting comfortably in exam room  HENT:     Head: Normocephalic and atraumatic.  Cardiovascular:     Rate and Rhythm: Normal rate and regular rhythm.     Heart sounds: Normal heart sounds, S1 normal and S2 normal. No murmur heard. Pulmonary:     Effort: Pulmonary effort is normal.     Breath sounds: Normal breath sounds. No wheezing, rhonchi or rales.     Comments: Clear to auscultation bilaterally Abdominal:     General: Bowel sounds are normal.     Palpations: Abdomen is soft.     Tenderness: There is abdominal tenderness in the right lower quadrant, suprapubic area and left lower quadrant. There is no right CVA tenderness, left CVA tenderness, guarding or rebound. Negative signs include Rovsing's sign, McBurney's sign and psoas sign.     Comments: Significant tenderness palpation throughout lower abdomen.  Genitourinary:    Labia:        Right: No rash or tenderness.        Left: No rash or tenderness.      Vagina: Bleeding present.     Cervix: Normal. No cervical motion tenderness.     Uterus: Normal.      Adnexa:        Right: Tenderness present. No mass.         Left: Tenderness present. No mass.       Comments: Blood noted posterior vaginal wall.  Tenderness palpation in bilateral adnexa without CMT tenderness.  Stacia, CMA present as chaperone during exam. Psychiatric:        Behavior: Behavior is cooperative.      UC Treatments / Results  Labs (all labs ordered are listed, but only abnormal results are displayed) Labs Reviewed  POCT URINE DIPSTICK - Abnormal;  Notable for the following components:      Result Value   Color, UA yellow (*)    Clarity, UA cloudy (*)    Bilirubin, UA small (*)    Blood, UA large (*)    POC PROTEIN,UA =30 (*)    Leukocytes, UA Trace (*)    All other components within normal limits  POCT URINE PREGNANCY - Normal  URINE CULTURE  MISC [facility] TEST (SEND OUT)  CERVICOVAGINAL ANCILLARY ONLY    EKG   Radiology No results found.  Procedures Procedures (including critical care time)  Medications Ordered in UC Medications  HYDROcodone -acetaminophen  (NORCO/VICODIN) 5-325 MG per tablet 1 tablet (1 tablet Oral Given 04/11/24 1149)    Initial Impression / Assessment and Plan / UC Course  I have reviewed the triage vital signs and the nursing notes.  Pertinent labs & imaging results that were available during my care of the patient were reviewed by me and considered in my medical decision making (see chart for details).     Patient is well-appearing, afebrile, nontoxic, nontachycardic.  She does have tenderness palpation in adnexa but reports that her symptoms are persistent and have not worsened.  We discussed her limited capabilities in urgent care including our lack of imaging.  She has had multiple images of her abdomen pelvis in the past few weeks and since her symptoms began that have not significantly changed despite her ongoing pain and so did not think it was necessary to go to the emergency room.  She did have  some adnexal tenderness on exam but no significant CMT tenderness and so I have low suspicion for pelvic inflammatory disease.  An STI swab was collected to look for gonorrhea, chlamydia, trichomonas, yeast, BV as well as mycoplasma genitalium.  We will contact her if any of this is positive we will need to start additional treatment.  I suspect her vaginal irritation is from a yeast infection triggered by the metronidazole .  Dose of Diflucan  was sent to pharmacy.  Urine pregnancy test was negative.  Her  UA did have blood consistent with menstrual bleeding and trace leukocyte esterase.  I suspect this is from vaginal irritation but will send this for culture to ensure that there is not a UTI contributing to her symptoms.  I  recommended blood testing including CBC to trend her white blood cell count given leukocytosis noted at one of her previous ER visits but patient declined this as she has had this multiple times in the past few weeks.  If she is not improving she will return and consider additional testing.  We discussed the importance of following up as soon as possible with her OB/GYN and she is going to call them to schedule appointment tomorrow.  She was provided a dose of hydrocodone  in clinic and reports that her boyfriend brought her to clinic and so she will not be driving home.  Discussed that we do not typically prescribe much of this medication in urgent care but given severity of pain I will provide a short course (#6, refill 0) but that we cannot provide any refills.  Review of [place]  controlled substance database shows no inappropriate refills with her only prescriptions from recent ER visits.  We also discussed that if this is related to endometriosis NSAIDs are the first-line of treatment and so recommended ibuprofen  which was sent to her pharmacy.  She was encouraged to use this on a scheduled basis.  No indication for dose adjustment based on metabolic panel from 04/07/2024 with creatinine of 0.73 and calculated creatinine clearance of 143 mL/min.  We discussed that if she has any worsening symptoms she should go to the ER for further evaluation and management.  Strict return precautions given.  She expressed understanding and agreed with the treatment plan.  Final Clinical Impressions(s) / UC Diagnoses   Final diagnoses:  Lower abdominal pain  Pelvic pain  Vaginal irritation     Discharge Instructions      Your urine was not convincing for an infection but given you are  having lower abdominal pain I am going to send this for culture and we will contact you if this is positive and changes our treatment plan.  I will contact you if any of your other testing is abnormal and we need to start additional medications.  I have called in a dose of Diflucan  as a suspect the vaginal irritation is related to yeast.  Take 1 dose of Diflucan  as soon as you get it.  As we discussed, I am not able to provide more than a few days of narcotic pain medication in urgent care and we will not be able to provide you a refill.  I have called in 6 tablets of hydrocodone .  This medication is both addictive and sedating so try to limit use as much as possible.  I have also called in ibuprofen  600 mg to be taken every 6-8 hours.  Do not combine this with NSAIDs including aspirin, ibuprofen /Advil , naproxen /Aleve .  It is very importantly follow-up  with OB/GYN as soon as possible.  If anything worsens please go to the emergency room for further evaluation and management as we discussed.     ED Prescriptions     Medication Sig Dispense Auth. Provider   HYDROcodone -acetaminophen  (NORCO/VICODIN) 5-325 MG tablet Take 1 tablet by mouth 2 (two) times daily as needed for up to 3 days. 6 tablet Diania Co K, PA-C   ibuprofen  (ADVIL ) 600 MG tablet Take 1 tablet (600 mg total) by mouth every 6 (six) hours as needed. 30 tablet Oziel Beitler K, PA-C   fluconazole  (DIFLUCAN ) 150 MG tablet Take 1 tablet (150 mg total) by mouth once for 1 dose. 1 tablet Arfa Lamarca K, PA-C      I have reviewed the PDMP during this encounter.   Sherrell Rocky POUR, PA-C 04/11/24 1229

## 2024-04-11 NOTE — ED Triage Notes (Signed)
 Patient states has been seen 3 times for the same abdominal pain. Onset 1 month ago. Has nausea, diarrhea, no emesis. Denies any new foods, meds, or travel.  No one with similar symptoms, no known sick exposure.   States was given med from the hospital that caused dysuria and yeast symptoms. States tongue I currently swollen and painful. Last took the medication this morning.

## 2024-04-12 ENCOUNTER — Ambulatory Visit: Payer: Self-pay | Admitting: Student

## 2024-04-12 ENCOUNTER — Ambulatory Visit (HOSPITAL_COMMUNITY): Payer: Self-pay

## 2024-04-12 LAB — CERVICOVAGINAL ANCILLARY ONLY
Bacterial Vaginitis (gardnerella): POSITIVE — AB
Candida Glabrata: NEGATIVE
Candida Vaginitis: NEGATIVE
Chlamydia: NEGATIVE
Comment: NEGATIVE
Comment: NEGATIVE
Comment: NEGATIVE
Comment: NEGATIVE
Comment: NEGATIVE
Comment: NORMAL
Neisseria Gonorrhea: NEGATIVE
Trichomonas: NEGATIVE

## 2024-04-12 LAB — URINE CULTURE

## 2024-04-12 MED ORDER — FLUCONAZOLE 150 MG PO TABS: 150.0000 mg | ORAL_TABLET | Freq: Once | ORAL | 0 refills | Status: AC

## 2024-04-12 MED ORDER — METRONIDAZOLE 500 MG PO TABS: 500.0000 mg | ORAL_TABLET | Freq: Two times a day (BID) | ORAL | 0 refills | Status: AC

## 2024-04-13 ENCOUNTER — Encounter (HOSPITAL_COMMUNITY): Payer: Self-pay | Admitting: Obstetrics and Gynecology

## 2024-04-13 ENCOUNTER — Observation Stay (HOSPITAL_COMMUNITY)

## 2024-04-13 ENCOUNTER — Observation Stay (HOSPITAL_COMMUNITY)
Admission: AD | Admit: 2024-04-13 | Discharge: 2024-04-15 | Disposition: A | Discharge status: Home / Self Care | Attending: Obstetrics and Gynecology | Admitting: Obstetrics and Gynecology

## 2024-04-13 ENCOUNTER — Other Ambulatory Visit: Payer: Self-pay

## 2024-04-13 ENCOUNTER — Ambulatory Visit: Admitting: Obstetrics and Gynecology

## 2024-04-13 VITALS — BP 146/95 | HR 82 | Ht 64.0 in | Wt 202.0 lb

## 2024-04-13 DIAGNOSIS — N83202 Unspecified ovarian cyst, left side: Secondary | ICD-10-CM | POA: Diagnosis not present

## 2024-04-13 DIAGNOSIS — R102 Pelvic and perineal pain unspecified side: Secondary | ICD-10-CM | POA: Diagnosis present

## 2024-04-13 DIAGNOSIS — R1900 Intra-abdominal and pelvic swelling, mass and lump, unspecified site: Secondary | ICD-10-CM | POA: Diagnosis not present

## 2024-04-13 DIAGNOSIS — F17210 Nicotine dependence, cigarettes, uncomplicated: Secondary | ICD-10-CM | POA: Insufficient documentation

## 2024-04-13 DIAGNOSIS — Z79899 Other long term (current) drug therapy: Secondary | ICD-10-CM | POA: Diagnosis not present

## 2024-04-13 DIAGNOSIS — I10 Essential (primary) hypertension: Secondary | ICD-10-CM | POA: Insufficient documentation

## 2024-04-13 DIAGNOSIS — N838 Other noninflammatory disorders of ovary, fallopian tube and broad ligament: Secondary | ICD-10-CM | POA: Diagnosis not present

## 2024-04-13 DIAGNOSIS — N83201 Unspecified ovarian cyst, right side: Secondary | ICD-10-CM | POA: Diagnosis not present

## 2024-04-13 DIAGNOSIS — D259 Leiomyoma of uterus, unspecified: Secondary | ICD-10-CM | POA: Diagnosis not present

## 2024-04-13 DIAGNOSIS — N80123 Deep endometriosis of bilateral ovaries: Secondary | ICD-10-CM | POA: Diagnosis present

## 2024-04-13 LAB — COMPREHENSIVE METABOLIC PANEL WITH GFR
ALT: 16 U/L (ref 0–44)
AST: 15 U/L (ref 15–41)
Albumin: 3.5 g/dL (ref 3.5–5.0)
Alkaline Phosphatase: 57 U/L (ref 38–126)
Anion gap: 7 (ref 5–15)
BUN: 15 mg/dL (ref 6–20)
CO2: 21 mmol/L — ABNORMAL LOW (ref 22–32)
Calcium: 8.3 mg/dL — ABNORMAL LOW (ref 8.9–10.3)
Chloride: 110 mmol/L (ref 98–111)
Creatinine, Ser: 0.78 mg/dL (ref 0.44–1.00)
GFR, Estimated: >60 mL/min (ref >60)
Glucose, Bld: 107 mg/dL — ABNORMAL HIGH (ref 70–99)
Potassium: 4.3 mmol/L (ref 3.5–5.1)
Sodium: 138 mmol/L (ref 135–145)
Total Bilirubin: 0.3 mg/dL (ref 0.0–1.2)
Total Protein: 6.6 g/dL (ref 6.5–8.1)

## 2024-04-13 LAB — CBC
HCT: 35 % — ABNORMAL LOW (ref 36.0–46.0)
Hemoglobin: 11 g/dL — ABNORMAL LOW (ref 12.0–15.0)
MCH: 26.6 pg (ref 26.0–34.0)
MCHC: 31.4 g/dL (ref 30.0–36.0)
MCV: 84.7 fL (ref 80.0–100.0)
Platelets: 311 K/uL (ref 150–400)
RBC: 4.13 MIL/uL (ref 3.87–5.11)
RDW: 14.4 % (ref 11.5–15.5)
WBC: 9.5 K/uL (ref 4.0–10.5)
nRBC: 0 % (ref 0.0–0.2)

## 2024-04-13 LAB — TYPE AND SCREEN
ABO/RH(D): O POS
Antibody Screen: NEGATIVE

## 2024-04-13 LAB — HCG, QUANTITATIVE, PREGNANCY: hCG, Beta Chain, Quant, S: <1 m[IU]/mL (ref <5)

## 2024-04-13 MED ORDER — HYDROMORPHONE HCL 2 MG/ML IJ SOLN: 0.2000 mg | INTRAMUSCULAR | Status: DC | PRN

## 2024-04-13 MED ORDER — BISACODYL 5 MG PO TBEC: 5.0000 mg | DELAYED_RELEASE_TABLET | Freq: Every day | ORAL | Status: DC | PRN

## 2024-04-13 MED ORDER — ONDANSETRON HCL 4 MG PO TABS: 4.0000 mg | ORAL_TABLET | Freq: Four times a day (QID) | ORAL | Status: DC | PRN

## 2024-04-13 MED ORDER — ZOLPIDEM TARTRATE 5 MG PO TABS: 5.0000 mg | ORAL_TABLET | Freq: Every evening | ORAL | Status: DC | PRN

## 2024-04-13 MED ORDER — SIMETHICONE 80 MG PO CHEW: 80.0000 mg | CHEWABLE_TABLET | Freq: Four times a day (QID) | ORAL | Status: AC | PRN

## 2024-04-13 MED ORDER — ACETAMINOPHEN 325 MG PO TABS: 1000.0000 mg | ORAL_TABLET | Freq: Four times a day (QID) | ORAL | Status: DC

## 2024-04-13 MED ORDER — ALUM & MAG HYDROXIDE-SIMETH 200-200-20 MG/5ML PO SUSP: 30.0000 mL | ORAL | Status: AC | PRN

## 2024-04-13 MED ORDER — OXYCODONE HCL 5 MG PO TABS: 5.0000 mg | ORAL_TABLET | ORAL | Status: DC | PRN

## 2024-04-13 MED ORDER — SODIUM CHLORIDE 0.9% FLUSH: 3.0000 mL | INTRAVENOUS | Status: DC | PRN

## 2024-04-13 MED ORDER — IBUPROFEN 600 MG PO TABS
600.0000 mg | ORAL_TABLET | Freq: Four times a day (QID) | ORAL | Status: DC | PRN
  Administered 2024-04-13: 600 mg via ORAL
  Filled 2024-04-13: qty 1

## 2024-04-13 MED ORDER — MAGNESIUM HYDROXIDE 400 MG/5ML PO SUSP
30.0000 mL | Freq: Every day | ORAL | Status: DC | PRN
Start: 2024-04-13 — End: 2024-04-14

## 2024-04-13 MED ORDER — LACTATED RINGERS IV SOLN: INTRAVENOUS | Status: DC

## 2024-04-13 MED ORDER — IBUPROFEN 200 MG PO TABS: 600.0000 mg | ORAL_TABLET | Freq: Four times a day (QID) | ORAL | Status: DC | PRN

## 2024-04-13 MED ORDER — PRENATAL MULTIVITAMIN CH: 1.0000 | ORAL_TABLET | Freq: Every day | ORAL | Status: DC

## 2024-04-13 MED ORDER — OXYCODONE-ACETAMINOPHEN 5-325 MG PO TABS
1.0000 | ORAL_TABLET | ORAL | Status: DC | PRN
  Administered 2024-04-13 (×2): 2 via ORAL
  Filled 2024-04-13 (×2): qty 2

## 2024-04-13 MED ORDER — MAGNESIUM CITRATE PO SOLN
1.0000 | Freq: Once | ORAL | Status: DC | PRN
Start: 2024-04-13 — End: 2024-04-14

## 2024-04-13 MED ORDER — DOCUSATE SODIUM 100 MG PO CAPS
100.0000 mg | ORAL_CAPSULE | Freq: Two times a day (BID) | ORAL | Status: DC
  Administered 2024-04-13: 100 mg via ORAL
  Filled 2024-04-13: qty 1

## 2024-04-13 MED ORDER — GADOBUTROL 1 MMOL/ML IV SOLN
9.0000 mL | Freq: Once | INTRAVENOUS | Status: AC | PRN
Start: 2024-04-13 — End: 2024-04-13
  Administered 2024-04-13: 9 mL via INTRAVENOUS

## 2024-04-13 MED ORDER — HYDROMORPHONE HCL 1 MG/ML IJ SOLN
0.2000 mg | INTRAMUSCULAR | Status: DC | PRN
  Administered 2024-04-13 – 2024-04-14 (×5): 0.6 mg via INTRAVENOUS
  Filled 2024-04-13 (×5): qty 1

## 2024-04-13 MED ORDER — ONDANSETRON HCL 4 MG/2ML IJ SOLN
4.0000 mg | Freq: Four times a day (QID) | INTRAMUSCULAR | Status: DC | PRN
  Administered 2024-04-14: 4 mg via INTRAVENOUS

## 2024-04-13 MED ORDER — SODIUM CHLORIDE 0.9 % IV SOLN: 250.0000 mL | INTRAVENOUS | Status: DC | PRN

## 2024-04-13 MED ORDER — SODIUM CHLORIDE 0.9% FLUSH: 3.0000 mL | Freq: Two times a day (BID) | INTRAVENOUS | Status: DC

## 2024-04-13 MED ORDER — INFLUENZA VIRUS VACC SPLIT PF (FLUZONE) 0.5 ML IM SUSY: 0.5000 mL | PREFILLED_SYRINGE | INTRAMUSCULAR | Status: DC

## 2024-04-13 NOTE — Progress Notes (Signed)
   RETURN GYNECOLOGY VISIT  Subjective:  [name] is a 43 y.o. [phone] with LMP 04/09/24 presenting for ED follow up for severe pelvic pain  Acute onset severe pelvic pain started ~4 weeks ago. Tried several OTC medications as she initially thought it was related to period cramps, but then her symptoms continued to worsen. Associated nausea, no vomiting. Presented to ED where imaging revealed 4.2cm R adnexal cyst on CT and then a pelvic US  revealed enlarged, lobulated R ovary measuring 4.3cm with a 4.3cm cyst with internal low level echos c/f complex cyst vs hemorrhagic cyst vs endometrioma. Left ovary was also enlarged. Pelvic MRI was recommended to further eval. Labs including CBC/CMP, lipase, UA, and hcg were unremarkable.   Since then, she has re-presented to the ED 3 additional times with severe pain. CT A/P was stable on 11/12. Labs continue to be unremarkable without leukocytosis, UA/UPT negative, GC/CT negative. No current e/o PID.   Today, she reports persistent severe pain despite taking toradol . Opioid pain medication has been helpful, but when she stops, the pain returns. Pain will feel a little better when she curls into the fetal position. It is limiting her ability to work and go about her daily activities. Denies fevers/chills or vaginal discharge. She is nauseated but not vomiting and is tolerating PO. Reports only time she has had pain like this was when she had appendicitis and an ectopic pregnancy.  This appt was originally scheduled the first week of December, but we moved it up to today as she was requested opioid pain medications for relief.  Surgical hx notable for prior CS, tubal ligation, lsc appe, and L salpingectomy for ruptured ectopic pregnancy. 2017 op note is available and notes omental adhesions at the umbilicus.   Objective:   Vitals:   04/13/24 0825 04/13/24 0832  BP: (!) 153/98 (!) 146/95  Pulse: 80 82  Weight: 202 lb (91.6 kg)   Height: 5' 4  (1.626 m)     General:  Alert, oriented and cooperative. Patient is in no acute distress.  Skin: Skin is warm and dry. No rash noted.   Cardiovascular: Normal heart rate noted  Respiratory: Normal respiratory effort, no problems with respiration noted  Abdomen: Soft, moderately tender in lower abdomen L > R with guarding. No rebound tenderness   Assessment and Plan:  [name] is a 43 y.o. with acute pelvic pain and ?4cm endometrioma  1. Acute pelvic pain (Primary) - Given acute onset, severe pain and exam with peritoneal signs needs expedited work up and likely diagnostic laparoscopy - Discussed with 2nd attending on call who is agreeable with direct admission for pain control, imaging (repeat pelvic US  & pelvic MRI) and likely diagnostic laparoscopy on 11/19 - Patient in agreement with plan. [facility] notified and admission orders entered  Future Appointments  Date Time Provider Department Center  04/13/2024 12:35 PM [facility]  1 [facility]    Kieth JAYSON Carolin, MD

## 2024-04-13 NOTE — H&P (Signed)
 [facility] Attending History and Physical  [name] is a 43 y.o. [phone] who presented to [facility] clinic today for evaluation of pelvic pain/ER follow up.  She has had multiple ER visits for the same complaint. She has a known 4 cm cyst on the left side, hypoechoic with low level echoes, possible endometrioma. Her pain has been intermittent, improved with pain medications but then recurrent when not taking pain medications. Each time, pain has been sudden onset.    Past Medical History:  Diagnosis Date   Acute appendicitis 12/06/2011   HTN (hypertension)    Past Surgical History:  Procedure Laterality Date   CESAREAN SECTION  2007   DIAGNOSTIC LAPAROSCOPY WITH REMOVAL OF ECTOPIC PREGNANCY N/A 05/18/2016   Procedure: DIAGNOSTIC LAPAROSCOPY WITH LEFT SALPINGECTOMY;  Surgeon: Winton Felt, MD;  Location: [facility];  Service: Gynecology;  Laterality: N/A;   DILATION AND CURETTAGE OF UTERUS     LAPAROSCOPIC APPENDECTOMY  12/05/2011   Procedure: APPENDECTOMY LAPAROSCOPIC;  Surgeon: Elspeth KYM Schultze, MD;  Location: [facility];  Service: General;  Laterality: N/A;   TUBAL LIGATION     OB History  Gravida Para Term Preterm AB Living  4 2 1  2    SAB IAB Ectopic Multiple Live Births  1  1      # Outcome Date GA Lbr Len/2nd Weight Sex Type Anes PTL Lv  4 Ectopic           3 Para      CS-Unspec     2 Term      CS-Unspec     1 SAB           Patient denies any other pertinent gynecologic issues.  No current facility-administered medications on file prior to encounter.   Current Outpatient Medications on File Prior to Encounter  Medication Sig Dispense Refill   amLODipine  (NORVASC ) 10 MG tablet Take 1 tablet (10 mg total) by mouth at bedtime. 90 tablet 1   Drospirenone  (SLYND ) 4 MG TABS Take 1 tablet (4 mg total) by mouth daily. 28 tablet 12   HYDROcodone -acetaminophen  (NORCO/VICODIN) 5-325 MG tablet Take 1 tablet by mouth 2 (two) times daily as needed for  up to 3 days. 6 tablet 0   ibuprofen  (ADVIL ) 600 MG tablet Take 1 tablet (600 mg total) by mouth every 6 (six) hours as needed. 30 tablet 0   metroNIDAZOLE  (FLAGYL ) 500 MG tablet Take 1 tablet (500 mg total) by mouth 2 (two) times daily for 7 days. 14 tablet 0   No Known Allergies  Social History:   reports that she has been smoking cigarettes. She has a 4 pack-year smoking history. She has never used smokeless tobacco. She reports that she does not currently use alcohol. She reports that she does not use drugs. Family History  Problem Relation Age of Onset   Diabetes Father     Review of Systems: Pertinent items noted in HPI and remainder of comprehensive ROS otherwise negative.  PHYSICAL EXAM: Blood pressure (!) 151/83, pulse 88, temperature 97.6 F (36.4 C), temperature source Oral, resp. rate 16, last menstrual period 04/09/2024, SpO2 97%. CONSTITUTIONAL: Well-developed, well-nourished female in no acute distress.  HENT:  Normocephalic, atraumatic, External right and left ear normal. Oropharynx is clear and moist EYES: Conjunctivae and EOM are normal. Pupils are equal, round, and reactive to light. No scleral icterus.  NECK: Normal range of motion, supple, no masses SKIN: Skin is warm and dry. No rash noted. Not  diaphoretic. No erythema. No pallor. NEUROLOGIC: Alert and oriented to person, place, and time. Normal reflexes, muscle tone coordination. No cranial nerve deficit noted. PSYCHIATRIC: Normal mood and affect. Normal behavior. Normal judgment and thought content. CARDIOVASCULAR: Normal heart rate noted, regular rhythm RESPIRATORY: Effort and breath sounds normal, no problems with respiration noted ABDOMEN: Soft, nontender, nondistended. PELVIC: deferred MUSCULOSKELETAL: Normal range of motion. No tenderness.     Labs: Results for orders placed or performed during the hospital encounter of 04/11/24 (from the past 2 weeks)  POC Urinalysis Dipstick   Collection Time: 04/11/24  11:46 AM  Result Value Ref Range   Color, UA yellow (A) yellow   Clarity, UA cloudy (A) clear   Glucose, UA negative negative mg/dL   Bilirubin, UA small (A) negative   Ketones, POC UA negative negative mg/dL   Spec Grav, UA 8.979 8.989 - 1.025   Blood, UA large (A) negative   pH, UA 6.5 5.0 - 8.0   POC PROTEIN,UA =30 (A) negative, trace   Urobilinogen, UA 0.2 0.2 or 1.0 E.U./dL   Nitrite, UA Negative Negative   Leukocytes, UA Trace (A) Negative  POCT urine pregnancy   Collection Time: 04/11/24 11:47 AM  Result Value Ref Range   Preg Test, Ur Negative Negative  Urine Culture   Collection Time: 04/11/24 12:04 PM   Specimen: Urine, Clean Catch  Result Value Ref Range   Specimen Description URINE, CLEAN CATCH    Special Requests      NONE Performed at [facility], [address], [place], [place] [address]    Culture MULTIPLE SPECIES PRESENT, SUGGEST RECOLLECTION (A)    Report Status 04/12/2024 FINAL   Cervicovaginal ancillary only   Collection Time: 04/11/24 12:07 PM  Result Value Ref Range   Neisseria Gonorrhea Negative    Chlamydia Negative    Trichomonas Negative    Bacterial Vaginitis (gardnerella) Positive (A)    Candida Vaginitis Negative    Candida Glabrata Negative    Comment Normal Reference Ranger Chlamydia - Negative    Comment      Normal Reference Range Neisseria Gonorrhea - Negative   Comment      Normal Reference Range Bacterial Vaginosis - Negative   Comment Normal Reference Range Candida Species - Negative    Comment Normal Reference Range Candida Galbrata - Negative    Comment Normal Reference Range Trichomonas - Negative   Results for orders placed or performed during the hospital encounter of 04/07/24 (from the past 2 weeks)  Lipase, blood   Collection Time: 04/07/24 12:57 AM  Result Value Ref Range   Lipase 29 11 - 51 U/L  Comprehensive metabolic panel   Collection Time: 04/07/24 12:57 AM  Result Value Ref Range   Sodium 139 135 - 145  mmol/L   Potassium 3.7 3.5 - 5.1 mmol/L   Chloride 107 98 - 111 mmol/L   CO2 23 22 - 32 mmol/L   Glucose, Bld 102 (H) 70 - 99 mg/dL   BUN 11 6 - 20 mg/dL   Creatinine, Ser 9.26 0.44 - 1.00 mg/dL   Calcium 9.1 8.9 - 89.6 mg/dL   Total Protein 7.3 6.5 - 8.1 g/dL   Albumin 4.2 3.5 - 5.0 g/dL   AST 15 15 - 41 U/L   ALT 14 0 - 44 U/L   Alkaline Phosphatase 68 38 - 126 U/L   Total Bilirubin 0.2 0.0 - 1.2 mg/dL   GFR, Estimated >39 >39 mL/min   Anion gap  9 5 - 15  CBC   Collection Time: 04/07/24 12:57 AM  Result Value Ref Range   WBC 9.8 4.0 - 10.5 K/uL   RBC 4.55 3.87 - 5.11 MIL/uL   Hemoglobin 12.3 12.0 - 15.0 g/dL   HCT 61.2 63.9 - 53.9 %   MCV 85.1 80.0 - 100.0 fL   MCH 27.0 26.0 - 34.0 pg   MCHC 31.8 30.0 - 36.0 g/dL   RDW 85.5 88.4 - 84.4 %   Platelets 348 150 - 400 K/uL   nRBC 0.0 0.0 - 0.2 %  hCG, serum, qualitative   Collection Time: 04/07/24 12:57 AM  Result Value Ref Range   Preg, Serum NEGATIVE NEGATIVE  Urinalysis, Routine w reflex microscopic -Urine, Clean Catch   Collection Time: 04/07/24  2:50 AM  Result Value Ref Range   Color, Urine STRAW (A) YELLOW   APPearance CLEAR CLEAR   Specific Gravity, Urine 1.035 (H) 1.005 - 1.030   pH 7.0 5.0 - 8.0   Glucose, UA NEGATIVE NEGATIVE mg/dL   Hgb urine dipstick MODERATE (A) NEGATIVE   Bilirubin Urine NEGATIVE NEGATIVE   Ketones, ur NEGATIVE NEGATIVE mg/dL   Protein, ur NEGATIVE NEGATIVE mg/dL   Nitrite NEGATIVE NEGATIVE   Leukocytes,Ua NEGATIVE NEGATIVE   RBC / HPF 0-5 0 - 5 RBC/hpf   WBC, UA 0-5 0 - 5 WBC/hpf   Bacteria, UA NONE SEEN NONE SEEN   Squamous Epithelial / HPF 0-5 0 - 5 /HPF   Mucus PRESENT   GC/Chlamydia probe amp ([place]) not at [facility]   Collection Time: 04/07/24  3:30 AM  Result Value Ref Range   Neisseria Gonorrhea Negative    Chlamydia Negative    Comment Normal Reference Ranger Chlamydia - Negative    Comment      Normal Reference Range Neisseria Gonorrhea - Negative  Wet prep, genital    Collection Time: 04/07/24  3:38 AM   Specimen: Vaginal  Result Value Ref Range   Yeast Wet Prep HPF POC NONE SEEN NONE SEEN   Trich, Wet Prep NONE SEEN NONE SEEN   Clue Cells Wet Prep HPF POC PRESENT (A) NONE SEEN   WBC, Wet Prep HPF POC <10 <10   Sperm NONE SEEN   Results for orders placed or performed during the hospital encounter of 04/03/24 (from the past 2 weeks)  Urinalysis, Routine w reflex microscopic -Urine, Clean Catch   Collection Time: 04/03/24  2:22 PM  Result Value Ref Range   Color, Urine YELLOW YELLOW   APPearance HAZY (A) CLEAR   Specific Gravity, Urine 1.028 1.005 - 1.030   pH 5.0 5.0 - 8.0   Glucose, UA NEGATIVE NEGATIVE mg/dL   Hgb urine dipstick SMALL (A) NEGATIVE   Bilirubin Urine NEGATIVE NEGATIVE   Ketones, ur NEGATIVE NEGATIVE mg/dL   Protein, ur NEGATIVE NEGATIVE mg/dL   Nitrite NEGATIVE NEGATIVE   Leukocytes,Ua NEGATIVE NEGATIVE   RBC / HPF 0-5 0 - 5 RBC/hpf   WBC, UA 0-5 0 - 5 WBC/hpf   Bacteria, UA NONE SEEN NONE SEEN   Squamous Epithelial / HPF 0-5 0 - 5 /HPF   Mucus PRESENT   Comprehensive metabolic panel   Collection Time: 04/03/24  2:29 PM  Result Value Ref Range   Sodium 136 135 - 145 mmol/L   Potassium 3.9 3.5 - 5.1 mmol/L   Chloride 108 98 - 111 mmol/L   CO2 19 (L) 22 - 32 mmol/L   Glucose, Bld 109 (H) 70 -  99 mg/dL   BUN 13 6 - 20 mg/dL   Creatinine, Ser 9.20 0.44 - 1.00 mg/dL   Calcium 8.8 (L) 8.9 - 10.3 mg/dL   Total Protein 7.2 6.5 - 8.1 g/dL   Albumin 3.8 3.5 - 5.0 g/dL   AST 14 (L) 15 - 41 U/L   ALT 15 0 - 44 U/L   Alkaline Phosphatase 57 38 - 126 U/L   Total Bilirubin 0.5 0.0 - 1.2 mg/dL   GFR, Estimated >39 >39 mL/min   Anion gap 9 5 - 15  Lipase, blood   Collection Time: 04/03/24  2:29 PM  Result Value Ref Range   Lipase 29 11 - 51 U/L  CBC with Diff   Collection Time: 04/03/24  2:29 PM  Result Value Ref Range   WBC 11.0 (H) 4.0 - 10.5 K/uL   RBC 4.59 3.87 - 5.11 MIL/uL   Hemoglobin 12.4 12.0 - 15.0 g/dL   HCT  60.8 63.9 - 53.9 %   MCV 85.2 80.0 - 100.0 fL   MCH 27.0 26.0 - 34.0 pg   MCHC 31.7 30.0 - 36.0 g/dL   RDW 85.3 88.4 - 84.4 %   Platelets 361 150 - 400 K/uL   nRBC 0.0 0.0 - 0.2 %   Neutrophils Relative % 57 %   Neutro Abs 6.3 1.7 - 7.7 K/uL   Lymphocytes Relative 33 %   Lymphs Abs 3.6 0.7 - 4.0 K/uL   Monocytes Relative 6 %   Monocytes Absolute 0.7 0.1 - 1.0 K/uL   Eosinophils Relative 2 %   Eosinophils Absolute 0.3 0.0 - 0.5 K/uL   Basophils Relative 1 %   Basophils Absolute 0.1 0.0 - 0.1 K/uL   Immature Granulocytes 1 %   Abs Immature Granulocytes 0.08 (H) 0.00 - 0.07 K/uL  hCG, serum, qualitative   Collection Time: 04/03/24  2:29 PM  Result Value Ref Range   Preg, Serum NEGATIVE NEGATIVE    Imaging Studies: CT ABDOMEN PELVIS W CONTRAST Result Date: 04/07/2024 EXAM: CT ABDOMEN AND PELVIS WITH CONTRAST 04/07/2024 02:20:52 AM TECHNIQUE: CT of the abdomen and pelvis was performed with the administration of 100 mL of iohexol  (OMNIPAQUE ) 300 MG/ML solution. Multiplanar reformatted images are provided for review. Automated exposure control, iterative reconstruction, and/or weight-based adjustment of the mA/kV was utilized to reduce the radiation dose to as low as reasonably achievable. COMPARISON: 03/20/2024 CLINICAL HISTORY: Left lower quadrant pain. FINDINGS: LOWER CHEST: Lung bases are free from acute inflammatory changes and sizeable effusion. LIVER: A hypodense lesion is noted in the dome of the liver, stable from the prior exam. GALLBLADDER AND BILE DUCTS: Gallbladder is within normal limits. No biliary ductal dilatation. SPLEEN: Spleen is unremarkable. PANCREAS: Pancreas is unremarkable. ADRENAL GLANDS: Adrenal glands are within normal limits. KIDNEYS, URETERS AND BLADDER: Kidneys demonstrate a normal enhancement pattern bilaterally. No renal calculi. No obstructive changes are seen. The bladder is well distended. GI AND BOWEL: Stomach demonstrates no acute abnormality. The small  bowel is unremarkable. No obstructive or inflammatory changes of the colon are seen. The appendix has been surgically removed. PERITONEUM AND RETROPERITONEUM: No free fluid is noted. No free air. VASCULATURE: The aorta is within normal limits. LYMPH NODES: No lymphadenopathy is noted. REPRODUCTIVE ORGANS: Uterus is within normal limits. A stable right adnexal cyst is seen. BONES AND SOFT TISSUES: No bony abnormalities noted. IMPRESSION: 1. No acute findings in the abdomen or pelvis. No change from the prior exam. Electronically signed by: Oneil Devonshire MD 04/07/2024  02:31 AM EST RP Workstation: [id number]   US  PELVIC COMPLETE W TRANSVAGINAL AND TORSION R/O Result Date: 03/20/2024 EXAM: US  Pelvis, Complete Transvaginal and Transabdominal with Doppler 03/20/2024 06:44:00 PM TECHNIQUE: Transabdominal and transvaginal pelvic duplex ultrasound using B-mode/gray scaled imaging with Doppler spectral analysis and color flow was obtained. COMPARISON: CT abdomen and pelvis 03/20/2024. Pelvic ultrasound 06/21/2000. CLINICAL HISTORY: Adnexal pain. FINDINGS: UTERUS: Uterus measures 10.6 x 5.0 x 5.1 cm, volume 141 mm. Uterus demonstrates normal myometrial echotexture. There is a posterior intramural fibroid measuring 1.2 x 1.3 x 1.1 cm. ENDOMETRIAL STRIPE: Endometrial measures 6 mm. Endometrial stripe is within normal limits. RIGHT OVARY: Right ovary is enlarged and lobulated measuring 4.3 x 3.0 x 3.7 cm, volume 25 ml. Appearances complex containing multiple small heterogeneous areas. There is normal arterial and venous Doppler flow. LEFT OVARY: Left ovary is lobulated measuring 4.7 x 2.6 x 2.2 cm, volume 15 ml. Small complex cystic components are seen throughout the left ovary. There is normal arterial and venous Doppler flow. RIGHT ADNEXA: Within the right adnexa adjacent to the right ovary there is a 4.1 x 4.3 x 2.9 cm hypoechoic structure with internal low-level echos. There is no internal vascularity in this region. FREE  FLUID: No free fluid. IMPRESSION: 1. Right adnexal hypoechoic structure (4.3 x 4.1 x 2.9 cm) with internal low-level echoes and no internal vascularity. Findings may represent a complex or hemorrhagic cyst or endometrioma. Recommend ultrasound follow-up in 612 weeks to assess evolution. If new nodularity, thick septation (3 mm), or flow develops, recommend surgical evaluation or MRI with IV contrast. 2. Enlarged and lobulated ovaries with multiple small heterogeneous/complex areas; recommend gynecology consultation or pelvic MRI with IV contrast to further evaluate non-simple features. 3. Posterior intramural uterine fibroid measuring 1.2 x 1.3 x 1.1 cm. Electronically signed by: Greig Pique MD 03/20/2024 06:52 PM EDT RP Workstation: [id number]   CT ABDOMEN PELVIS W CONTRAST Result Date: 03/20/2024 EXAM: CT ABDOMEN AND PELVIS WITH CONTRAST 03/20/2024 04:18:04 PM TECHNIQUE: CT of the abdomen and pelvis was performed with the administration of 75 mL of iohexol  (OMNIPAQUE ) 350 MG/ML injection. Multiplanar reformatted images are provided for review. Automated exposure control, iterative reconstruction, and/or weight-based adjustment of the mA/kV was utilized to reduce the radiation dose to as low as reasonably achievable. COMPARISON: CT 12/05/2011. CLINICAL HISTORY: LLQ abdominal pain. FINDINGS: LOWER CHEST: No acute abnormality. LIVER: Within the dome of the liver there is a 10 mm hypoenhancing lesion on image number 9 which is not changed in size from 10 mm on comparison CT from 2013. There is focal fatty infiltration along the falciform ligament. GALLBLADDER AND BILE DUCTS: Gallbladder is unremarkable. No biliary ductal dilatation. SPLEEN: No acute abnormality. PANCREAS: No acute abnormality. ADRENAL GLANDS: No acute abnormality. KIDNEYS, URETERS AND BLADDER: No stones in the kidneys or ureters. No hydronephrosis. No perinephric or periureteral stranding. Urinary bladder is unremarkable. GI AND BOWEL: Stomach  demonstrates no acute abnormality. Post appendectomy. Left colon normal. There is no bowel obstruction. PERITONEUM AND RETROPERITONEUM: No ascites. No free air. VASCULATURE: Aorta is normal in caliber. LYMPH NODES: No lymphadenopathy. REPRODUCTIVE ORGANS: Uterus and ovaries are within normal limits. Simple fluid attenuation cyst superior to the right ovary measures 4.2 x 3.3 cm and is uniform low attenuation. BONES AND SOFT TISSUES: No acute osseous abnormality. No focal soft tissue abnormality. No inguinal hernia or ventral hernia. IMPRESSION: 1. No acute findings in the abdomen or pelvis. 2. Simple-appearing right adnexal cyst measuring 4.2 cm; in a premenopausal patient, no  follow-up is recommended. In a postmenopausal patient, recommend pelvic ultrasound in 612 months. 3. Stable 10 mm hepatic lesion in the dome of the liver, unchanged from 2013. Electronically signed by: Norleen Boxer MD 03/20/2024 04:47 PM EDT RP Workstation: [id number]    Assessment/Plan: Principal Problem:   Pelvic mass Active Problems:   Pelvic pain Concern for torsion, either chronic or intermittent Reviewed plan for imaging with ultrasound and MRI to better characterize mass and evaluate for torsion Plan for surgery tomorrow pending above findings Reviewed plan for laparoscopic unilateral salpingo-oophorectomy Reviewed that if the ovarian cyst is not torsed or ends up not being the source of her pain, she may continue to have pain or worsening pain after surgery. She verbalized her understanding of this  Plan: Imaging with ultrasound and MRI Surgery tomorrow  Rollo ONEIDA Bring, MD, FACOG Obstetrician & Gynecologist, [facility] for [facility], [facility]

## 2024-04-13 NOTE — Progress Notes (Signed)
 Pt has been recently seen at ED and UC for pelvic pain, has had u/s and CT scan.  Pt states pain is across lower abdomen. Pt states she only has relief with pain medication.  Pt has elevated BP today, not taking Rx.  Advised to follow up with PCP.

## 2024-04-14 ENCOUNTER — Observation Stay (HOSPITAL_COMMUNITY)

## 2024-04-14 ENCOUNTER — Other Ambulatory Visit: Payer: Self-pay

## 2024-04-14 ENCOUNTER — Encounter (HOSPITAL_COMMUNITY): Payer: Self-pay | Admitting: Obstetrics and Gynecology

## 2024-04-14 ENCOUNTER — Encounter (HOSPITAL_COMMUNITY)
Admission: AD | Disposition: A | Discharge status: Home / Self Care | Payer: Self-pay | Source: Home / Self Care | Attending: Obstetrics and Gynecology

## 2024-04-14 DIAGNOSIS — K660 Peritoneal adhesions (postprocedural) (postinfection): Secondary | ICD-10-CM

## 2024-04-14 DIAGNOSIS — N80123 Deep endometriosis of bilateral ovaries: Secondary | ICD-10-CM | POA: Diagnosis not present

## 2024-04-14 DIAGNOSIS — R1900 Intra-abdominal and pelvic swelling, mass and lump, unspecified site: Secondary | ICD-10-CM | POA: Diagnosis not present

## 2024-04-14 DIAGNOSIS — N9489 Other specified conditions associated with female genital organs and menstrual cycle: Secondary | ICD-10-CM | POA: Diagnosis not present

## 2024-04-14 DIAGNOSIS — N736 Female pelvic peritoneal adhesions (postinfective): Secondary | ICD-10-CM

## 2024-04-14 HISTORY — PX: LAPAROSCOPIC SALPINGO OOPHERECTOMY: SHX5927

## 2024-04-14 SURGERY — SALPINGO-OOPHORECTOMY, LAPAROSCOPIC
Anesthesia: General

## 2024-04-14 MED ORDER — PROPOFOL 10 MG/ML IV BOLUS
INTRAVENOUS | Status: DC | PRN
Start: 2024-04-14 — End: 2024-04-14
  Administered 2024-04-14: 50 mg via INTRAVENOUS
  Administered 2024-04-14: 100 mg via INTRAVENOUS
  Administered 2024-04-14: 50 mg via INTRAVENOUS

## 2024-04-14 MED ORDER — PROPOFOL 10 MG/ML IV BOLUS
INTRAVENOUS | Status: AC
  Filled 2024-04-14: qty 20

## 2024-04-14 MED ORDER — FENTANYL CITRATE (PF) 100 MCG/2ML IJ SOLN
INTRAMUSCULAR | Status: AC
  Filled 2024-04-14: qty 2

## 2024-04-14 MED ORDER — SODIUM CHLORIDE 0.9 % IR SOLN
Status: DC | PRN
  Administered 2024-04-14: 1000 mL

## 2024-04-14 MED ORDER — ROCURONIUM BROMIDE 10 MG/ML (PF) SYRINGE
PREFILLED_SYRINGE | INTRAVENOUS | Status: AC
  Filled 2024-04-14: qty 10

## 2024-04-14 MED ORDER — MIDAZOLAM HCL (PF) 2 MG/2ML IJ SOLN
INTRAMUSCULAR | Status: DC | PRN
  Administered 2024-04-14: 2 mg via INTRAVENOUS

## 2024-04-14 MED ORDER — MIDAZOLAM HCL 2 MG/2ML IJ SOLN
INTRAMUSCULAR | Status: AC
  Filled 2024-04-14: qty 2

## 2024-04-14 MED ORDER — LIDOCAINE 2% (20 MG/ML) 5 ML SYRINGE
INTRAMUSCULAR | Status: AC
  Filled 2024-04-14: qty 5

## 2024-04-14 MED ORDER — ONDANSETRON HCL 4 MG/2ML IJ SOLN
INTRAMUSCULAR | Status: AC
  Filled 2024-04-14: qty 2

## 2024-04-14 MED ORDER — FENTANYL CITRATE (PF) 100 MCG/2ML IJ SOLN
25.0000 ug | INTRAMUSCULAR | Status: DC | PRN
  Administered 2024-04-14 (×2): 25 ug via INTRAVENOUS
  Administered 2024-04-14: 50 ug via INTRAVENOUS

## 2024-04-14 MED ORDER — ONDANSETRON HCL 4 MG/2ML IJ SOLN: 4.0000 mg | Freq: Once | INTRAMUSCULAR | Status: DC | PRN

## 2024-04-14 MED ORDER — BUPIVACAINE HCL (PF) 0.25 % IJ SOLN
INTRAMUSCULAR | Status: AC
  Filled 2024-04-14: qty 30

## 2024-04-14 MED ORDER — METRONIDAZOLE 500 MG PO TABS
500.0000 mg | ORAL_TABLET | Freq: Two times a day (BID) | ORAL | Status: DC
  Administered 2024-04-14 – 2024-04-15 (×3): 500 mg via ORAL
  Filled 2024-04-14 (×3): qty 1

## 2024-04-14 MED ORDER — OXYCODONE-ACETAMINOPHEN 5-325 MG PO TABS
1.0000 | ORAL_TABLET | ORAL | Status: DC | PRN
  Administered 2024-04-14 – 2024-04-15 (×3): 2 via ORAL
  Filled 2024-04-14 (×4): qty 2

## 2024-04-14 MED ORDER — CHLORHEXIDINE GLUCONATE 0.12 % MT SOLN: 15.0000 mL | Freq: Once | OROMUCOSAL | Status: AC

## 2024-04-14 MED ORDER — LIDOCAINE 2% (20 MG/ML) 5 ML SYRINGE
INTRAMUSCULAR | Status: DC | PRN
Start: 2024-04-14 — End: 2024-04-14
  Administered 2024-04-14: 100 mg via INTRAVENOUS

## 2024-04-14 MED ORDER — LACTATED RINGERS IV SOLN: INTRAVENOUS | Status: DC

## 2024-04-14 MED ORDER — OXYCODONE HCL 5 MG PO TABS: 5.0000 mg | ORAL_TABLET | Freq: Once | ORAL | Status: DC | PRN

## 2024-04-14 MED ORDER — FENTANYL CITRATE (PF) 250 MCG/5ML IJ SOLN
INTRAMUSCULAR | Status: DC | PRN
  Administered 2024-04-14 (×4): 50 ug via INTRAVENOUS

## 2024-04-14 MED ORDER — HYDROMORPHONE HCL 1 MG/ML IJ SOLN
0.2000 mg | INTRAMUSCULAR | Status: DC | PRN
  Administered 2024-04-14 – 2024-04-15 (×5): 0.6 mg via INTRAVENOUS
  Filled 2024-04-14 (×5): qty 1

## 2024-04-14 MED ORDER — DEXAMETHASONE SOD PHOSPHATE PF 10 MG/ML IJ SOLN
INTRAMUSCULAR | Status: DC | PRN
Start: 2024-04-14 — End: 2024-04-14
  Administered 2024-04-14: 10 mg via INTRAVENOUS

## 2024-04-14 MED ORDER — AMLODIPINE BESYLATE 5 MG PO TABS
10.0000 mg | ORAL_TABLET | Freq: Every day | ORAL | Status: DC
  Administered 2024-04-14: 10 mg via ORAL
  Filled 2024-04-14: qty 2

## 2024-04-14 MED ORDER — ACETAMINOPHEN 10 MG/ML IV SOLN
INTRAVENOUS | Status: AC
  Filled 2024-04-14: qty 100

## 2024-04-14 MED ORDER — SUCCINYLCHOLINE CHLORIDE 200 MG/10ML IV SOSY
PREFILLED_SYRINGE | INTRAVENOUS | Status: DC | PRN
  Administered 2024-04-14: 100 mg via INTRAVENOUS

## 2024-04-14 MED ORDER — AMISULPRIDE (ANTIEMETIC) 5 MG/2ML IV SOLN: 10.0000 mg | Freq: Once | INTRAVENOUS | Status: DC | PRN

## 2024-04-14 MED ORDER — NOREPINEPHRINE 4 MG/250ML-% IV SOLN
INTRAVENOUS | Status: AC
  Filled 2024-04-14: qty 250

## 2024-04-14 MED ORDER — BUPIVACAINE HCL (PF) 0.25 % IJ SOLN
INTRAMUSCULAR | Status: DC | PRN
  Administered 2024-04-14: 11 mL

## 2024-04-14 MED ORDER — 0.9 % SODIUM CHLORIDE (POUR BTL) OPTIME
TOPICAL | Status: DC | PRN
  Administered 2024-04-14: 1000 mL

## 2024-04-14 MED ORDER — PHENYLEPHRINE 80 MCG/ML (10ML) SYRINGE FOR IV PUSH (FOR BLOOD PRESSURE SUPPORT)
PREFILLED_SYRINGE | INTRAVENOUS | Status: AC
  Filled 2024-04-14: qty 10

## 2024-04-14 MED ORDER — CHLORHEXIDINE GLUCONATE 0.12 % MT SOLN
OROMUCOSAL | Status: AC
Start: 2024-04-14 — End: 2024-04-14
  Administered 2024-04-14: 15 mL via OROMUCOSAL
  Filled 2024-04-14: qty 15

## 2024-04-14 MED ORDER — ROCURONIUM BROMIDE 10 MG/ML (PF) SYRINGE
PREFILLED_SYRINGE | INTRAVENOUS | Status: DC | PRN
Start: 2024-04-14 — End: 2024-04-14
  Administered 2024-04-14 (×2): 30 mg via INTRAVENOUS

## 2024-04-14 MED ORDER — DOCUSATE SODIUM 100 MG PO CAPS
100.0000 mg | ORAL_CAPSULE | Freq: Two times a day (BID) | ORAL | Status: DC
  Administered 2024-04-14 – 2024-04-15 (×3): 100 mg via ORAL
  Filled 2024-04-14 (×4): qty 1

## 2024-04-14 MED ORDER — ORAL CARE MOUTH RINSE: 15.0000 mL | Freq: Once | OROMUCOSAL | Status: AC

## 2024-04-14 MED ORDER — PHENYLEPHRINE HCL-NACL 20-0.9 MG/250ML-% IV SOLN
INTRAVENOUS | Status: DC | PRN
Start: 2024-04-14 — End: 2024-04-14
  Administered 2024-04-14: 80 ug via INTRAVENOUS

## 2024-04-14 MED ORDER — IBUPROFEN 600 MG PO TABS
600.0000 mg | ORAL_TABLET | Freq: Four times a day (QID) | ORAL | Status: DC
  Administered 2024-04-14 – 2024-04-15 (×4): 600 mg via ORAL
  Filled 2024-04-14 (×4): qty 1

## 2024-04-14 MED ORDER — SUGAMMADEX SODIUM 200 MG/2ML IV SOLN
INTRAVENOUS | Status: DC | PRN
  Administered 2024-04-14: 200 mg via INTRAVENOUS

## 2024-04-14 MED ORDER — OXYCODONE HCL 5 MG/5ML PO SOLN: 5.0000 mg | Freq: Once | ORAL | Status: DC | PRN

## 2024-04-14 MED ORDER — ACETAMINOPHEN 10 MG/ML IV SOLN
1000.0000 mg | Freq: Once | INTRAVENOUS | Status: DC | PRN
  Administered 2024-04-14: 1000 mg via INTRAVENOUS

## 2024-04-14 SURGICAL SUPPLY — 29 items
CHLORAPREP W/TINT 26 (MISCELLANEOUS) ×1 IMPLANT
DERMABOND ADVANCED .7 DNX12 (GAUZE/BANDAGES/DRESSINGS) IMPLANT
DRAPE SURG IRRIG POUCH 19X23 (DRAPES) ×1 IMPLANT
DRSG COVADERM PLUS 2X2 (GAUZE/BANDAGES/DRESSINGS) IMPLANT
DRSG OPSITE POSTOP 3X4 (GAUZE/BANDAGES/DRESSINGS) ×3 IMPLANT
GLOVE BIO SURGEON STRL SZ 6.5 (GLOVE) ×1 IMPLANT
GLOVE BIOGEL PI IND STRL 6.5 (GLOVE) ×1 IMPLANT
GOWN STRL REUS W/ TWL LRG LVL3 (GOWN DISPOSABLE) ×1 IMPLANT
IRRIGATION SUCT STRKRFLW 2 WTP (MISCELLANEOUS) IMPLANT
KIT PINK PAD W/HEAD ARM REST (MISCELLANEOUS) ×1 IMPLANT
KIT TURNOVER KIT B (KITS) ×1 IMPLANT
LIGASURE VESSEL 5MM BLUNT TIP (ELECTROSURGICAL) IMPLANT
PACK LAPAROSCOPY BASIN (CUSTOM PROCEDURE TRAY) ×1 IMPLANT
POUCH LAPAROSCOPIC INSTRUMENT (MISCELLANEOUS) IMPLANT
POWDER SURGICEL 3.0 GRAM (HEMOSTASIS) IMPLANT
SET TUBE SMOKE EVAC HIGH FLOW (TUBING) ×1 IMPLANT
SLEEVE Z-THREAD 5X100MM (TROCAR) ×1 IMPLANT
SOLN 0.9% NACL POUR BTL 1000ML (IV SOLUTION) ×1 IMPLANT
STRIP CLOSURE SKIN 1/2X4 (GAUZE/BANDAGES/DRESSINGS) ×1 IMPLANT
SUT MNCRL AB 4-0 PS2 18 (SUTURE) ×1 IMPLANT
SUT VICRYL 0 UR6 27IN ABS (SUTURE) ×1 IMPLANT
SYSTEM BAG RETRIEVAL 10MM (BASKET) IMPLANT
TIP ENDOSCOPIC SURGICEL (TIP) IMPLANT
TOWEL GREEN STERILE FF (TOWEL DISPOSABLE) ×2 IMPLANT
TRAP SPECIMEN MUCUS 40CC (MISCELLANEOUS) IMPLANT
TRAY FOLEY W/BAG SLVR 14FR (SET/KITS/TRAYS/PACK) ×1 IMPLANT
TROCAR BALLN 12MMX100 BLUNT (TROCAR) ×1 IMPLANT
TROCAR Z-THREAD OPTICAL 5X100M (TROCAR) ×1 IMPLANT
WARMER LAPAROSCOPE (MISCELLANEOUS) ×1 IMPLANT

## 2024-04-14 NOTE — Anesthesia Procedure Notes (Signed)
 Procedure Name: Intubation Date/Time: 04/14/2024 9:19 AM  Performed by: Leopold Smyers C, CRNAPre-anesthesia Checklist: Patient identified, Emergency Drugs available, Suction available and Patient being monitored Patient Re-evaluated:Patient Re-evaluated prior to induction Oxygen Delivery Method: Circle system utilized Preoxygenation: Pre-oxygenation with 100% oxygen Induction Type: IV induction Ventilation: Mask ventilation without difficulty Laryngoscope Size: Mac and 3 Grade View: Grade I Tube type: Oral Number of attempts: 1 Airway Equipment and Method: Stylet and Oral airway Placement Confirmation: ETT inserted through vocal cords under direct vision, positive ETCO2 and breath sounds checked- equal and bilateral Secured at: 22 cm Tube secured with: Tape Dental Injury: Teeth and Oropharynx as per pre-operative assessment

## 2024-04-14 NOTE — Anesthesia Preprocedure Evaluation (Addendum)
 Anesthesia Evaluation  Patient identified by MRN, date of birth, ID band Patient awake    Reviewed: Allergy & Precautions, NPO status , Patient's Chart, lab work & pertinent test results  History of Anesthesia Complications Negative for: history of anesthetic complications  Airway Mallampati: II  TM Distance: >3 FB Neck ROM: Full    Dental  (+) Teeth Intact, Dental Advisory Given   Pulmonary neg sleep apnea, neg COPD, neg recent URI, Current Smoker and Patient abstained from smoking.   breath sounds clear to auscultation       Cardiovascular hypertension (SBPs 140-150s), Pt. on medications (-) angina  Rhythm:Regular Rate:Normal  EKG (12/2023): NSR   Neuro/Psych neg Seizures    GI/Hepatic   Endo/Other  neg diabetes    Renal/GU Renal disease     Musculoskeletal   Abdominal   Peds  Hematology  (+) Blood dyscrasia, anemia Hgb 11.0, Plts 311K (04/13/24)   Anesthesia Other Findings   Reproductive/Obstetrics Hx of Ectopic Pregnancy s/p L Tubal Ligation (2017); R Adenexal Mass                               Anesthesia Physical Anesthesia Plan  ASA: 2  Anesthesia Plan: General   Post-op Pain Management:    Induction: Intravenous and Rapid sequence  PONV Risk Score and Plan: 2 and Ondansetron , Dexamethasone , Treatment may vary due to age or medical condition, Midazolam , Scopolamine patch - Pre-op and Propofol  infusion  Airway Management Planned: Oral ETT  Additional Equipment:   Intra-op Plan:   Post-operative Plan: Extubation in OR  Informed Consent:      Dental advisory given  Plan Discussed with: CRNA  Anesthesia Plan Comments:          Anesthesia Quick Evaluation

## 2024-04-14 NOTE — Progress Notes (Signed)
 Surgical consent updated now that MRI read has returned  MRI 04/13/24: IMPRESSION: 1. Complex bilateral ovarian and adnexal cysts, including a separate right adnexal complex cyst, without internal enhancing nodularity; benign etiology favored and endometriomas are not excluded. The patient is immediately preoperative for salpingectomy and correlation with resulting pathology is suggested.  Discussed MRI findings with the patient in detail. Reviewed possibility of bilateral salpingo-oophorectomy.   Consent updated: Diagnostic laparoscopy, unilateral salpingo-oophorectomy, possible bilateral salpingo-oophorectomy, possible unilateral or bilateral ovarian cystectomies  Reviewed that if we remove her ovaries, this will put her into surgical menopause. She verbalized her understanding of this and is ok with removing both her ovaries if needed.  Other risks/benefits are as outlined in previous consent note.  Rollo ONEIDA Bring, MD, FACOG Obstetrician & Gynecologist, [facility] for [facility], [facility]

## 2024-04-14 NOTE — Transfer of Care (Signed)
 Immediate Anesthesia Transfer of Care Note  Patient: [name]  Procedure(s) Performed: Diagnostic Laparoscopy Pelvic washings and lysis of adhesions  Patient Location: PACU  Anesthesia Type:General  Level of Consciousness: awake and alert   Airway & Oxygen Therapy: Patient Spontanous Breathing and Patient connected to face mask oxygen  Post-op Assessment: Report given to RN and Post -op Vital signs reviewed and stable  Post vital signs: Reviewed and stable  Last Vitals:  Vitals Value Taken Time  BP 161/103 04/14/24 10:54  Temp    Pulse 75 04/14/24 10:57  Resp 14 04/14/24 10:57  SpO2 92 % 04/14/24 10:57  Vitals shown include unfiled device data.  Last Pain:  Vitals:   04/14/24 0829  TempSrc:   PainSc: 4       Patients Stated Pain Goal: 3 (04/14/24 0715)  Complications: No notable events documented.

## 2024-04-14 NOTE — Op Note (Signed)
 JEANNE DIEFENDORF PROCEDURE DATE: 04/14/2024  PREOPERATIVE DIAGNOSIS: Bilateral pelvic masses  POSTOPERATIVE DIAGNOSIS: Same plus extensive endometriosis   PROCEDURE: Diagnostic laparoscopy, lysis of adhesions, pelvic washings  SURGEON:  Dr. Rollo T. Abigail, MD  ASSISTANT: Dr. Kieth Carolin, MD  ANESTHESIOLOGY TEAM: Anesthesiologist: Waddell Lauraine NOVAK, MD CRNA: Dorma Jonny BROCKS, CRNA  INDICATIONS: 43 y.o. [phone] here with the preoperative diagnoses as listed above.  Please refer to preoperative notes for more details. Patient was counseled regarding options and elected surgical management  FINDINGS:   1) Omental adhesions to anterior abdominal wall near umbilicus 2) Bilateral endometriomas, approximately 5 cm on the right and 4 cm on the left, densely adherent to pelvic side walls and posterior cul de sac/bowel 3) Obliterated posterior cul de sac with filmy and dense adhesions of bowel to uterus and adnexa 4) Uterus slightly enlarged but otherwise normal appearing  ANESTHESIA: General, 0.25% marcaine  local  INTRAVENOUS FLUIDS: 700 ml  ESTIMATED BLOOD LOSS: 5 ml  URINE OUTPUT: 300 ml  SPECIMENS:  Pelvic washings  COMPLICATIONS: None immediate  PROCEDURE IN DETAIL:  The patient was taken to the operating room where general anesthesia was administered and was found to be adequate.  She was placed in the dorsal lithotomy position, and was prepped and draped in a sterile manner.  A Foley catheter was inserted into her bladder and a uterine manipulator was placed.    She was prepped and draped in the usual sterile fashion in the dorsal lithotomy position. 0.25% marcaine   was injected at each port site prior to skin incision for a total of 15 ml. A skin incision was made with the scalpel inferior to the umbilicus. The abdomen was entered routinely with [facility] technique. The [facility] port was placed and the abdomen insufflated to 15 mmHg. The scalpel was then used to make two lateral  5mm incisions, one on the right, one on the left.  Two 5mm ports were inserted under direct visualization. An omental adhesion to the anterior abdominal wall was partially resected using the Ligasure device in order to improve visualization. The patient was placed in trendelenburg. Pelvic washings were collected.  Attention was turned to the pelvis and extensive endometriosis with obliteration of the posterior cul de sac was noted. The ovaries were adherent to the pelvic side walls and bowel. We gently attempted to mobilize her ovaries and by lysing filmy adhesions of the adnexa and the posterior cul de sac from the bowel. It became apparent that here ovaries were densely adherent and unable to be easily mobilized. We decided the risks of continuing the surgery were greater than the benefit at this point, and she would benefit from management with either a minimally invasive surgeon, gyn oncology and/or general surgery as part of her care in a coordinated effort. Surgicel powder was applied to oozing serosal surfaces of the uterus and adnexa. The pelvis was irrigated. The ports were removed.  The infraumbilical fascia incision was closed with 0-vicryl. The skin incisions were then closed in a subcuticular fashion with 4-0 monocryl and dressings were placed. She was taken to recovery in stable condition.   Rollo ONEIDA Abigail, MD, FACOG Obstetrician & Gynecologist, [facility] for [facility], [facility]

## 2024-04-14 NOTE — Anesthesia Postprocedure Evaluation (Signed)
 Anesthesia Post Note  Patient: [name]  Procedure(s) Performed: Diagnostic Laparoscopy Pelvic washings and lysis of adhesions     Patient location during evaluation: PACU Anesthesia Type: General Level of consciousness: awake Pain management: pain level controlled Vital Signs Assessment: post-procedure vital signs reviewed and stable Respiratory status: spontaneous breathing Cardiovascular status: blood pressure returned to baseline Postop Assessment: no apparent nausea or vomiting Anesthetic complications: no   No notable events documented.                Lauraine KATHEE [place]

## 2024-04-14 NOTE — Progress Notes (Signed)
 Gynecology Progress Note  Admission Date: 04/13/2024 Current Date: 04/14/2024 7:54 AM  [name] is a 43 y.o. [id number] HD#2 admitted for pelvic pain and pelvic mass     Subjective:  Doing well this morning Reports pain improved with pain medications Ready for surgery  Objective:   Vitals:   04/13/24 1556 04/13/24 2032 04/13/24 2328 04/14/24 0739  BP: (!) 152/82 (!) 153/83 (!) 153/84 (!) 157/92  Pulse: 75 79 76 73  Resp: 17 16 17 18   Temp: 97.7 F (36.5 C) 97.9 F (36.6 C) 98.2 F (36.8 C) 98.1 F (36.7 C)  TempSrc: Oral Oral Oral Oral  SpO2: 98% 99% 99% 99%      Physical exam: General appearance: appears well, alert/oriented Abdomen: soft, moderately tender, nondistended Extremities: no lower extremity edema Skin: warm/dry Psych: appropriate    Medications Current Facility-Administered Medications  Medication Dose Route Frequency Provider Last Rate Last Admin   bisacodyl  (DULCOLAX) EC tablet 5 mg  5 mg Oral Daily PRN Abigail Rollo DASEN, MD       chlorhexidine (PERIDEX) 0.12 % solution            HYDROmorphone  (DILAUDID ) injection 0.2-0.6 mg  0.2-0.6 mg Intravenous Q2H PRN Abigail Rollo DASEN, MD   0.6 mg at 04/14/24 9394   ibuprofen  (ADVIL ) tablet 600 mg  600 mg Oral Q6H PRN Abigail Rollo DASEN, MD   600 mg at 04/13/24 2046   influenza vac split trivalent PF (FLUZONE) injection 0.5 mL  0.5 mL Intramuscular Tomorrow-1000 Abigail Rollo DASEN, MD       lactated ringers  infusion   Intravenous Continuous Abigail Rollo DASEN, MD 125 mL/hr at 04/14/24 0610 New Bag at 04/14/24 0610   magnesium  citrate solution 1 Bottle  1 Bottle Oral Once PRN Abigail Rollo DASEN, MD       magnesium  hydroxide (MILK OF MAGNESIA) suspension 30 mL  30 mL Oral Daily PRN Abigail Rollo DASEN, MD       ondansetron  (ZOFRAN ) tablet 4 mg  4 mg Oral Q6H PRN Abigail Rollo DASEN, MD       Or   ondansetron  (ZOFRAN ) injection 4 mg  4 mg Intravenous Q6H PRN Abigail Rollo DASEN, MD       oxyCODONE -acetaminophen   (PERCOCET/ROXICET) 5-325 MG per tablet 1-2 tablet  1-2 tablet Oral Q3H PRN Abigail Rollo DASEN, MD   2 tablet at 04/13/24 2332   zolpidem (AMBIEN) tablet 5 mg  5 mg Oral QHS PRN Abigail Rollo DASEN, MD          Labs  Recent Labs  Lab 04/13/24 1447  WBC 9.5  HGB 11.0*  HCT 35.0*  PLT 311    Recent Labs  Lab 04/13/24 1432  NA 138  K 4.3  CL 110  CO2 21*  BUN 15  CREATININE 0.78  CALCIUM 8.3*  PROT 6.6  BILITOT 0.3  ALKPHOS 57  ALT 16  AST 15  GLUCOSE 107*    Radiology Pelvic ultrasound: IMPRESSION: 1. Right adnexal cystic mass is again seen measuring 3.6 x 2.3 x 2.3 cm. This is nonvascular and simple appearing. This is favored as a benign process. Recommend follow-up ultrasound in 6-12 weeks to ensure resolution. 2. Right ovary is heterogeneous and enlarged without definitive focal lesion. Recommend attention on follow-up ultrasound or further evaluation with MRI. 3. Small amount of free fluid in the right adnexa. 4. Posterior intramural/submucosal fibroid in the lower uterine segment measuring 1.7 x 1.3 x 1.3 cm.    MRI read pending  Assessment & Plan:  Pelvic pain, pelvic mass  Awaiting final read on MRI- reading room contacted so that we have that prior to procedure  To OR this morning for: Diagnostic laparoscopy, possible unilateral salpingo-oophorectomy  Discussed possibility of ovarian torsion Also discussed possibility that this is not the source of her pain and may not improve her pain. She verbalized understanding of this.  Surgical consent: The risks, benefits and alternatives of the procedure were reviewed in detail. The risks of surgery were discussed with the patient including but were not limited to:    -- Bleeding with associated risks of blood transfusion  -- Infection which may require antibiotics -- Injury to bowel, bladder, ureters, tubes, ovaries, uterus or other surrounding organs  -- Need for additional procedures  -- Formation of  adhesions -- Incisional problems --Thromboembolic phenomenon such as MI, CVA or death and other postoperative/anesthesia complications.     The patient concurred with the proposed plan, giving informed written consent for the procedure.     Rollo ONEIDA Bring, MD, FACOG Obstetrician & Gynecologist, [facility] for [facility], [facility]

## 2024-04-14 NOTE — Plan of Care (Signed)

## 2024-04-15 ENCOUNTER — Encounter (HOSPITAL_COMMUNITY): Payer: Self-pay | Admitting: Obstetrics and Gynecology

## 2024-04-15 DIAGNOSIS — N80123 Deep endometriosis of bilateral ovaries: Secondary | ICD-10-CM | POA: Diagnosis present

## 2024-04-15 DIAGNOSIS — R1900 Intra-abdominal and pelvic swelling, mass and lump, unspecified site: Secondary | ICD-10-CM | POA: Diagnosis not present

## 2024-04-15 LAB — CYTOLOGY - NON PAP

## 2024-04-15 MED ORDER — IBUPROFEN 600 MG PO TABS: 600.0000 mg | ORAL_TABLET | Freq: Four times a day (QID) | ORAL | 0 refills | Status: DC

## 2024-04-15 MED ORDER — SENNA 8.6 MG PO TABS: 2.0000 | ORAL_TABLET | Freq: Every day | ORAL | 0 refills | Status: DC

## 2024-04-15 MED ORDER — NORETHINDRONE ACETATE 5 MG PO TABS: 10.0000 mg | ORAL_TABLET | Freq: Every day | ORAL | 2 refills | Status: DC

## 2024-04-15 MED ORDER — OXYCODONE HCL 5 MG PO TABS: 5.0000 mg | ORAL_TABLET | ORAL | 0 refills | Status: DC | PRN

## 2024-04-15 MED ORDER — ACETAMINOPHEN 500 MG PO TABS: 1000.0000 mg | ORAL_TABLET | Freq: Four times a day (QID) | ORAL | 0 refills | Status: AC

## 2024-04-15 NOTE — Progress Notes (Signed)
Discharge instructions and prescriptions given to pt. Discussed post surgical care, signs and symptoms to report to the MD, upcoming appointments, and meds. Pt verbalizes understanding and has no questions or concerns at this time. Pt discharged home from hospital in stable condition.

## 2024-04-15 NOTE — Discharge Summary (Signed)
 Gynecology Physician Postoperative Discharge Summary  Patient ID: [name] MRN: [id number] DOB/AGE: Nov 18, 1980 43 y.o.  Admit Date: 04/13/2024 Discharge Date: 04/15/2024  Admission Diagnoses:  Pelvic mass [R19.00]  Discharge Diagnoses:  S/p diagnostic laparoscopy, pelvic washings & LOA Bilateral endometriomas  Procedures: Procedure(s) (LRB): Diagnostic Laparoscopy Pelvic washings and lysis of adhesions (N/A)  Hospital Course:  [name] is a 43 y.o. [phone] who was admitted for severe acute-on-chronic pelvic pain and adnexal cysts/masses concerning for possible ovarian torsion vs endometriomas. She underwent a diagnostic laparoscopy that confirmed the presence of bilateral endometriomas that were densely adherent to the pelvic side walls, posterior cul de sac and sigmoid colon/rectum. We were unable to safely complete cystectomy or oophorectomy due to these adhesions. For further details about surgery, please refer to the operative report. Patient had an uncomplicated postoperative course. By time of discharge on POD#1, her pain was controlled, she was ambulating, voiding without difficulty, tolerating regular diet and passing flatus.   We discussed management of her endometriosis. If symptoms can be adequately controlled with medications, I would not recommend further surgical management. She is not currently insured but has medicaid pending. She has contraindications to estrogen (HTN, tobacco use), so we will trial aygestin 10mg  daily. If symptoms are not controlled, plan to transition to Orilissa once [employer] is approved.   In the meantime she will be referred to [facility]/Onc to establish care in case surgical management is ultimately needed.   She was discharged home in stable condition and will follow up for post operative care in 4 weeks.   Significant Labs:    Latest Ref Rng & Units 04/13/2024    2:47 PM 04/07/2024   12:57 AM 04/03/2024    2:29 PM   CBC  WBC 4.0 - 10.5 K/uL 9.5  9.8  11.0   Hemoglobin 12.0 - 15.0 g/dL 88.9  87.6  87.5   Hematocrit 36.0 - 46.0 % 35.0  38.7  39.1   Platelets 150 - 400 K/uL 311  348  361     Discharge Exam: Blood pressure (!) 150/78, pulse 76, temperature 98.5 F (36.9 C), temperature source Oral, resp. rate 18, height (P) 5' 4 (1.626 m), weight (P) 91.6 kg, last menstrual period 04/09/2024, SpO2 98%. General appearance: alert and no distress  Resp: normal effort Cardio: regular rate  GI: soft, mildly distended, mildly diffusely tender without rebound or guarding Incision: Umbilical, RLQ, and LLQ sites with dressing in place, clean/dry/intact Extremities no sign of DVT  Discharged Condition: Stable  Disposition: Discharge disposition: 01-Home or Self Care       Discharge Instructions     Call MD for:  persistant nausea and vomiting   Complete by: As directed    Call MD for:  redness, tenderness, or signs of infection (pain, swelling, redness, odor or green/yellow discharge around incision site)   Complete by: As directed    Call MD for:  severe uncontrolled pain   Complete by: As directed    Call MD for:  temperature >100.4   Complete by: As directed    Diet general   Complete by: As directed    Discharge wound care:   Complete by: As directed    Do not remove surgical glue for 4 weeks. It is OK if it flakes or falls off on it's own   Driving Restrictions   Complete by: As directed    Do not drive while taking opioid pain medication   Increase activity slowly  Complete by: As directed    Lifting restrictions   Complete by: As directed    Do not lift more than 10-15lbs for 4 weeks   May walk up steps   Complete by: As directed    Other Restrictions   Complete by: As directed    Do not soak incisions for 4 weeks. This includes tub baths, pools, hot tubs. You may shower      Allergies as of 04/15/2024   No Known Allergies      Medication List     STOP taking these  medications    HYDROcodone -acetaminophen  5-325 MG tablet Commonly known as: NORCO/VICODIN   Slynd  4 MG Tabs Generic drug: Drospirenone        TAKE these medications    acetaminophen  500 MG tablet Commonly known as: TYLENOL  Take 2 tablets (1,000 mg total) by mouth every 6 (six) hours.   amLODipine  10 MG tablet Commonly known as: NORVASC  Take 1 tablet (10 mg total) by mouth at bedtime.   ibuprofen  600 MG tablet Commonly known as: ADVIL  Take 1 tablet (600 mg total) by mouth every 6 (six) hours. What changed:  when to take this reasons to take this   metroNIDAZOLE  500 MG tablet Commonly known as: FLAGYL  Take 1 tablet (500 mg total) by mouth 2 (two) times daily for 7 days.   norethindrone 5 MG tablet Commonly known as: AYGESTIN Take 2 tablets (10 mg total) by mouth daily.   oxyCODONE  5 MG immediate release tablet Commonly known as: Oxy IR/ROXICODONE  Take 1 tablet (5 mg total) by mouth every 4 (four) hours as needed for breakthrough pain.   senna 8.6 MG Tabs tablet Commonly known as: SENOKOT Take 2 tablets (17.2 mg total) by mouth at bedtime.               Discharge Care Instructions  (From admission, onward)           Start     Ordered   04/15/24 0000  Discharge wound care:       Comments: Do not remove surgical glue for 4 weeks. It is OK if it flakes or falls off on it's own   04/15/24 0755           No future appointments.  Follow-up Information     [facility] for [facility] Follow up in 4 week(s).   Specialty: Obstetrics and Gynecology Why: For routine post operative care Contact information: [address] [place]  [address] [phone]               Total discharge time: 20 minutes   Signed: Kieth Carolin, MD Obstetrician & Gynecologist, [facility] for [facility], [facility]

## 2024-04-15 NOTE — Discharge Instructions (Signed)
 Post-surgical Instructions, Outpatient Surgery  You may expect to feel dizzy, weak, and drowsy for as long as 24 hours after receiving the medicine that made you sleep (anesthetic). For the first 24 hours after your surgery:   Do not drive a car, ride a bicycle, participate in physical activities, or take public transportation until you are done taking narcotic pain medicines  Do not drink alcohol or take tranquilizers.  Do not take medicine that has not been prescribed by your physicians.  Do not sign important papers or make important decisions while on narcotic pain medicines.  Have a responsible person with you.   CARE OF INCISION If you have a bandage, you may remove it in one day.  If there are steri-strips or dermabond, just let this loosen on its own.  You may shower on the first day after your surgery.  Do not sit in a tub bath for four weeks. Avoid heavy lifting (more than 10 pounds/4.5 kilograms), pushing, or pulling.  Avoid activities that may risk injury to your incisions.   PAIN MANAGEMENT Ibuprofen  600mg .  (This is the same as 3-200mg  over the counter tablets of Motrin  or ibuprofen .)  Take this every 6 hours or as needed for cramping.   Acetaminophen  1000mg  (This is the same as 2-500mg  over the counter extra strength tylenol ). Take this every 6 hours for the first 3 days or as needed afterwards for pain Oxycodone  5mg . For more severe pain, take one or two tablets every four to six hours as needed for pain control.  (Remember that narcotic pain medications increase your risk of constipation.  If this becomes a problem, you may take an over the counter laxative like miralax.)  DO'S AND DON'T'S Do not take a tub bath for 4 weeks.  You may shower on the first day after your surgery Do not do any heavy lifting for 4 weeks.  This increases the chance of bleeding. Do move around as you feel able.  Stairs are fine.  You may begin to exercise again as you feel able.  Do not lift any  weights for four weeks. Do not put anything in the vagina for two weeks--no tampons, intercourse, or douching.    REGULAR MEDIATIONS/VITAMINS: You may restart all of your regular medications as prescribed. You may restart all of your vitamins as you normally take them.    PLEASE CALL OR SEEK MEDICAL CARE IF: You have persistent nausea and vomiting.  You have trouble eating or drinking.  You have an oral temperature above 100.5.  You have constipation that is not helped by adjusting diet or increasing fluid intake. Pain medicines are a common cause of constipation.  You have heavy vaginal bleeding You have redness or drainage from your incision(s) or there is increasing pain or tenderness near or in the surgical site.

## 2024-04-16 ENCOUNTER — Ambulatory Visit: Payer: Self-pay | Admitting: Obstetrics and Gynecology

## 2024-04-21 ENCOUNTER — Other Ambulatory Visit: Payer: Self-pay | Admitting: Obstetrics and Gynecology

## 2024-04-21 MED ORDER — ELAGOLIX SODIUM 150 MG PO TABS: 1.0000 | ORAL_TABLET | Freq: Every day | ORAL | 11 refills | Status: AC

## 2024-04-21 NOTE — Progress Notes (Signed)
 Rx for orilissa  sent

## 2024-04-26 ENCOUNTER — Encounter: Payer: Self-pay | Admitting: Obstetrics and Gynecology

## 2024-04-26 ENCOUNTER — Other Ambulatory Visit: Payer: Self-pay | Admitting: Obstetrics and Gynecology

## 2024-04-26 DIAGNOSIS — N809 Endometriosis, unspecified: Secondary | ICD-10-CM

## 2024-04-29 ENCOUNTER — Ambulatory Visit: Admitting: Obstetrics and Gynecology

## 2024-04-29 ENCOUNTER — Telehealth: Payer: Self-pay | Admitting: *Deleted

## 2024-04-29 NOTE — Telephone Encounter (Signed)
 Spoke with the patient regarding the referral to GYN oncology. Patient scheduled as new patient with Dr Viktoria on 1/8 at 11:15 am. Patient given an arrival time of 10:45 am.  Explained to the patient the the doctor will perform a pelvic exam at this visit. Patient given the policy that only one visitor allowed and that visitor must be over 16 yrs are allowed in the Cancer Center. Patient given the address/phone number for the clinic and that the center offers free valet service. Patient aware that masks optional.

## 2024-05-03 MED ORDER — OXYCODONE HCL 5 MG PO TABS: 5.0000 mg | ORAL_TABLET | Freq: Four times a day (QID) | ORAL | 0 refills | Status: DC | PRN

## 2024-05-04 ENCOUNTER — Other Ambulatory Visit: Payer: Self-pay

## 2024-05-04 MED ORDER — IBUPROFEN 800 MG PO TABS: 800.0000 mg | ORAL_TABLET | Freq: Three times a day (TID) | ORAL | 0 refills | Status: AC | PRN

## 2024-05-04 MED ORDER — ACETAMINOPHEN 500 MG PO TABS: 1000.0000 mg | ORAL_TABLET | Freq: Four times a day (QID) | ORAL | 0 refills | Status: DC | PRN

## 2024-05-14 ENCOUNTER — Ambulatory Visit: Admitting: Obstetrics and Gynecology

## 2024-05-14 VITALS — BP 110/77 | HR 88 | Ht 64.0 in | Wt 201.0 lb

## 2024-05-14 DIAGNOSIS — Z09 Encounter for follow-up examination after completed treatment for conditions other than malignant neoplasm: Secondary | ICD-10-CM

## 2024-05-14 DIAGNOSIS — N80123 Deep endometriosis of bilateral ovaries: Secondary | ICD-10-CM

## 2024-05-14 DIAGNOSIS — N809 Endometriosis, unspecified: Secondary | ICD-10-CM

## 2024-05-14 MED ORDER — FAMOTIDINE 20 MG PO TABS: 20.0000 mg | ORAL_TABLET | Freq: Every day | ORAL | 11 refills | Status: DC

## 2024-05-14 NOTE — Progress Notes (Signed)
 43 y.o. GYN presents for Post Op.

## 2024-05-14 NOTE — Progress Notes (Signed)
" ° °  POST OPERATIVE GYNECOLOGY VISIT  Subjective:  [name] is a 43 y.o. [phone] 4wk s/p dx lsc for pelvic pain and adnexal masses, found to have b/l endometriomas & significant adhesive disease of the posterior cul de sac; here for post op appt  Pelvic washings from her surgery c/w acute inflammation and reactive mesothelial cells; no e/o malignancy.  Intra op images are available in chart.   Since her surgery, she got her period and had extreme pain. She had a bloody liquid bowel movement while on her period. She also notes heartburn. We have started elagolix in an attempt to control her endometriosis & pain medically.   She has an appointment scheduled with Dr. Viktoria on 06/03/24 for discussion of surgical management if symptoms cannot be controlled medically.   Got the job working at [employer] Starts mid-Jan  Objective:   Vitals:   05/14/24 0931  BP: 110/77  Pulse: 88  Weight: 201 lb (91.2 kg)  Height: 5' 4 (1.626 m)   General:  Alert, oriented and cooperative. Patient is in no acute distress.  Skin: Skin is warm and dry. No rash noted.   Cardiovascular: Normal heart rate noted  Respiratory: Normal respiratory effort, no problems with respiration noted  Abdomen: Soft, non-tender, non-distended. Incisions well healed   Assessment and Plan:  [name] is a 43 y.o. 4wk s/p dx lsc presenting for post op follow up  - Recovered well post op, no further restrictions - Reviewed that as a rule I do not prescribe opioids for endometriosis pain and that we will need to work with pain specialist. Referral placed - Continue elagolix; our hope is that periods will completely stop & this will control pain medically - Reviewed significant risks with surgical management - Follow up with Dr. Viktoria as planned - PCP follow up for general health management - Pepcid  rx for heartburn  Return in about 3 months (around 08/12/2024) for follow up endometriosis with [facility].  Future  Appointments  Date Time Provider Department Center  06/03/2024 11:15 AM Savannah Comer JONELLE, MD [facility] None   Savannah JAYSON Carolin, MD  "

## 2024-06-01 ENCOUNTER — Encounter: Payer: Self-pay | Admitting: Gynecologic Oncology

## 2024-06-03 ENCOUNTER — Inpatient Hospital Stay: Attending: Gynecologic Oncology | Admitting: Gynecologic Oncology

## 2024-06-03 DIAGNOSIS — I10 Essential (primary) hypertension: Secondary | ICD-10-CM | POA: Diagnosis not present

## 2024-06-03 DIAGNOSIS — Z9079 Acquired absence of other genital organ(s): Secondary | ICD-10-CM | POA: Insufficient documentation

## 2024-06-03 DIAGNOSIS — N80123 Deep endometriosis of bilateral ovaries: Secondary | ICD-10-CM | POA: Insufficient documentation

## 2024-06-03 DIAGNOSIS — N920 Excessive and frequent menstruation with regular cycle: Secondary | ICD-10-CM | POA: Insufficient documentation

## 2024-06-03 DIAGNOSIS — F17210 Nicotine dependence, cigarettes, uncomplicated: Secondary | ICD-10-CM | POA: Diagnosis not present

## 2024-06-03 DIAGNOSIS — N946 Dysmenorrhea, unspecified: Secondary | ICD-10-CM | POA: Insufficient documentation

## 2024-06-03 DIAGNOSIS — Z79899 Other long term (current) drug therapy: Secondary | ICD-10-CM | POA: Diagnosis not present

## 2024-06-03 DIAGNOSIS — N809 Endometriosis, unspecified: Secondary | ICD-10-CM | POA: Diagnosis present

## 2024-06-03 DIAGNOSIS — R102 Pelvic and perineal pain unspecified side: Secondary | ICD-10-CM | POA: Insufficient documentation

## 2024-06-03 NOTE — Progress Notes (Unsigned)
 Patient was a no show to her new patient visit

## 2024-06-07 ENCOUNTER — Telehealth: Payer: Self-pay

## 2024-06-07 NOTE — Telephone Encounter (Signed)
 [name] did not show fro appointment with Dr.Tucker on 1/8. She called this morning to reschedule. Per Eleanor Epps NP ok to add on for 1/16. Pt is aware and agrees to date/time

## 2024-06-10 NOTE — Progress Notes (Signed)
 GYNECOLOGIC ONCOLOGY NEW PATIENT CONSULTATION   Patient Name: [name]  Patient Age: 44 y.o. Date of Service: 06/11/24 Referring Provider: Kieth Carolin, MD  Primary Care Provider: Lonnie Earnest, MD Consulting Provider: Comer Dollar, MD   Assessment/Plan:  Premenopausal patient with stage IV endometriosis.  Spent quite some time reviewing her symptomatology as well as more recent history.  At the time of prior surgery, most notably in 2017 at the time of her ectopic surgery, there were no reported findings of endometriosis.  We reviewed in detail findings during her most recent surgery and looked at pictures taken laparoscopically together.  Discussed organs that are involved by her endometriosis.  I reviewed the pathogenesis of endometriosis, common symptoms, and treatment.  We discussed that medical treatment is the mainstay for endometriosis.  I reviewed multiple different treatment options that can be used in an effort to suppress ovarian function, specifically estrogen, that can provide decrease in symptoms or sometimes resolution of symptoms.  I encouraged the patient to monitor over the next couple of months to see if current therapy provides any relief.  If it does not, I stressed that we would typically try other forms of hormone modulation to see if we can control her endometriosis symptoms.  With regard to surgery, I reviewed in some detail what this would entail.  Given obliteration of the posterior cul-de-sac, we discussed the risk of injury to bowel during hysterectomy and the potential need for bowel resection.  While this would typically be a situation where reanastomosis is safe, I discussed the small risk that she would require an ostomy, typically that would be temporary.  Also discussed risk related to obliteration of retroperitoneal spaces and that this can increase the risk of damage to other organs such as the ureters.  Patient voiced understanding of the  increased surgical morbidity based on pelvic findings.  In terms of symptoms, it is somewhat difficult to discern if she may have colonic involvement beyond adhesive disease.  We discussed that if we ultimately are moving forward with definitive surgery in the future that I would recommend colonoscopy preoperatively.  We also discussed the importance of cutting back or quitting tobacco use if we are moving toward definitive surgery in the future.  The patient was previously referred to pain management clinic but is worried that she may have missed a call from them to schedule a visit because of some issues with her phone.  I asked my team to look into the referral and to provide the patient with the clinic number so that she may call to help facilitate getting herself scheduled.    No follow-up was scheduled with the patient.  I am happy to see her back in the future for discussion of medical management of endometriosis or if pursuing surgical intervention.  A copy of this note was sent to the patient's referring provider.   65 minutes of total time was spent for this patient encounter, including preparation, face-to-face counseling with the patient and coordination of care, and documentation of the encounter.  Comer Dollar, MD  Division of Gynecologic Oncology  Department of Obstetrics and Gynecology  [facility]  ___________________________________________  Chief Complaint: No chief complaint on file.   History of Present Illness:  [name] is a 44 y.o. y.o. female who is seen in consultation at the request of Dr. Carolin for an evaluation of endometriosis.  The patient's recent history is summarized below:  03/20/2024: Seen in the emergency  department for abdominal pain, similar to when she had had a prior tubal pregnancy or appendicitis.  CT of the abdomen and pelvis at that time showed no acute findings.  Simple appearing right adnexal cyst  measuring 4.2 cm.  Stable 10 mm hepatic lesion at the dome of the liver, unchanged from 2013.  Pelvic ultrasound was also performed showing a right hypoechoic adnexal structure measuring up to 4.3 cm with internal low-level echoes and no internal vascularity.  Thought to represent a complex or hemorrhagic cyst versus endometrioma.  Enlarged and lobulated ovaries with multiple small heterogenous complex areas, MRI recommended.  Posterior intramural uterine fibroid measuring 1.2 cm.  OB/GYN was consulted and recommended the patient's start progesterone only pill.   04/03/2024: Patient seen again in the emergency department with lower abdominal pain.  Not yet followed up with OB/GYN outpatient.  Patient was discharged with Toradol  and oxycodone .   04/07/2024: Patient seen in the emergency department again with abdominal pain and vaginal bleeding, worsened over the last 2 hours.  Was in the setting of running out of pain medication.  CT of the abdomen pelvis repeated with no acute findings noted, no change since prior exam.   04/11/2024: Patient seen again in the emergency department for severe lower abdominal pain running out of pain medication.   Patient established with OB/GYN on 04/13/2024.  Ultrasound on that day showed a uterus measuring 7.7 cm with a 1.7 cm posterior intramural/submucosal fibroid.  Endometrium measured 6.7 mm without focal abnormality.  Right ovary measured up to 4.8 cm.  There is a right adnexal cystic mass again seen measuring up to 3.6 cm that is nonvascular and simple appearing.  Left ovary normal in appearance.  Small amount of free fluid noted.   Given acute onset and severe pain noted at the time of this visit, expedited workup and diagnostic laparoscopy recommended.  Patient was direct admitted and taken to the OR on 11/19 for diagnostic laparoscopy.  Underwent lysis of adhesions and washings.  Findings at the time of surgery included omental adhesions to the anterior abdominal  wall near the umbilicus.  Bilateral endometriomas, 5 cm on the right and 4 cm on the left were densely adherent to the pelvic sidewalls and posterior cul-de-sac/bowel.  Obliterated posterior cul-de-sac with filmy and dense adhesions of the bowel to the uterus and adnexa.  Uterus slightly enlarged but otherwise normal in appearance.  Pelvic washings showed acute inflammation, reactive mesothelial cells, no malignant cells.   MRI of the pelvis performed on 04/14/2024 shows complex bilateral ovarian and adnexal cysts including a separate right adnexal cyst without internal enhancing nodularity.  Benign etiology favored and endometriomas are not excluded.   After surgery, patient was started on Elagolix.   Patient comes in by herself today.  She notes a long history of painful menstrual periods.  She endorses menarche at age 58.  Denies being on any treatment to help with heavy bleeding until last month.  Endorses regular menses that last for 7 days.  She has heavy bleeding for the first 4 days.  Uses Depends on these days, changes them 4 times on average and notes that they are mostly saturated.  Denies any intermenstrual bleeding.  During the first 3-4 days of her menses, she will have very sharp cramping pain that she describes as terrible.  A couple years ago, was on a Vicodin and ibuprofen  combination medication that helped her symptoms.  Denies any help with over-the-counter medications.  Has some relief with  heat therapy.  She started Elagolix last month, has only had 1 cycle since she started.  Did not notice any difference in her symptoms with this first menses.  Endorses a good appetite without nausea or emesis.  Reports regular bowel function.  She has some pain with her bowel movements when menstruating, denies any blood in her stool.  Denies any urinary symptoms.  She lives with her boyfriend.  Monday, she will be starting a job at [employer] in [employer].  Smokes about a half a pack a day.   Tried to quit a couple of years ago and successfully stopped smoking for about a month using Chantix .  PAST MEDICAL HISTORY:  Past Medical History:  Diagnosis Date   Acute appendicitis 12/06/2011   Endometriosis    HTN (hypertension)      PAST SURGICAL HISTORY:  Past Surgical History:  Procedure Laterality Date   CESAREAN SECTION  2007   x2   DIAGNOSTIC LAPAROSCOPY WITH REMOVAL OF ECTOPIC PREGNANCY N/A 05/18/2016   Procedure: DIAGNOSTIC LAPAROSCOPY WITH LEFT SALPINGECTOMY;  Surgeon: Winton Felt, MD;  Location: [facility];  Service: Gynecology;  Laterality: N/A;   DILATION AND CURETTAGE OF UTERUS     LAPAROSCOPIC APPENDECTOMY  12/05/2011   Procedure: APPENDECTOMY LAPAROSCOPIC;  Surgeon: Elspeth KYM Schultze, MD;  Location: [facility];  Service: General;  Laterality: N/A;   LAPAROSCOPIC SALPINGO OOPHERECTOMY N/A 04/14/2024   Procedure: Diagnostic Laparoscopy Pelvic washings and lysis of adhesions;  Surgeon: Abigail Rollo DASEN, MD;  Location[facility];  Service: Gynecology;  Laterality: N/A;  LAPARSCOPIC UNILATERAL SALPINGECTOMY OOPHORECTOMY   TUBAL LIGATION     at time of second c-section    OB/GYN HISTORY:  OB History  Gravida Para Term Preterm AB Living  4 2 1  2 2   SAB IAB Ectopic Multiple Live Births  1  1      # Outcome Date GA Lbr Len/2nd Weight Sex Type Anes PTL Lv  4 Ectopic           3 Para      CS-Unspec     2 Term      CS-Unspec     1 SAB             Patient's last menstrual period was 05/02/2024 (approximate).  Age at menarche: 22  Hx of STDs: denies Last pap: 11/2023 - NILM, HR HPV negative History of abnormal pap smears: denies  SCREENING STUDIES:  Last mammogram: 2022 Last colonoscopy: n/a  MEDICATIONS: Outpatient Encounter Medications as of 06/11/2024  Medication Sig   acetaminophen  (TYLENOL ) 500 MG tablet Take 2 tablets (1,000 mg total) by mouth every 6 (six) hours.   amLODipine  (NORVASC ) 10 MG tablet Take 1 tablet (10 mg total) by mouth at bedtime.   Elagolix  Sodium 150 MG TABS Take 1 tablet (150 mg total) by mouth daily.   ibuprofen  (ADVIL ) 800 MG tablet Take 1 tablet (800 mg total) by mouth every 8 (eight) hours as needed for moderate pain (pain score 4-6) or cramping.   SLYND  4 MG TABS Take 1 tablet by mouth daily.   Facility-Administered Encounter Medications as of 06/11/2024  Medication   alum & mag hydroxide-simeth (MAALOX/MYLANTA) 200-200-20 MG/5ML suspension 30 mL   simethicone  (MYLICON) chewable tablet 80 mg    ALLERGIES:  Allergies[1]   FAMILY HISTORY:  Family History  Problem Relation Age of Onset   Diabetes Father    Colon cancer Neg Hx    Breast cancer Neg Hx  Ovarian cancer Neg Hx    Endometrial cancer Neg Hx    Pancreatic cancer Neg Hx    Prostate cancer Neg Hx      SOCIAL HISTORY:  Social Connections: Unknown (06/12/2023)   Social Connection and Isolation Panel    Frequency of Communication with Friends and Family: More than three times a week    Frequency of Social Gatherings with Friends and Family: Not on file    Attends Religious Services: Patient declined    Active Member of Clubs or Organizations: No    Attends Engineer, Structural: Not on file    Marital Status: Living with partner    REVIEW OF SYSTEMS:  + abdominal pain, dyspareunia, hot flashes, back pain, muscle pain/cramps, dizziness, anxiety Denies appetite changes, fevers, chills, fatigue, unexplained weight changes. Denies hearing loss, neck lumps or masses, mouth sores, ringing in ears or voice changes. Denies cough or wheezing.  Denies shortness of breath. Denies chest pain or palpitations. Denies leg swelling. Denies abdominal distention, pain, blood in stools, constipation, diarrhea, nausea, vomiting, or early satiety. Denies pain with intercourse, dysuria, frequency, hematuria or incontinence. Denies hot flashes, pelvic pain, vaginal bleeding or vaginal discharge.   Denies joint pain, back pain or muscle pain/cramps. Denies itching,  rash, or wounds. Denies dizziness, headaches, numbness or seizures. Denies swollen lymph nodes or glands, denies easy bruising or bleeding. Denies anxiety, depression, confusion, or decreased concentration.  Physical Exam:  Vital Signs for this encounter:  Blood pressure 107/70, pulse 100, temperature 98.4 F (36.9 C), temperature source Oral, resp. rate 19, height 5' 4 (1.626 m), weight 198 lb 3.2 oz (89.9 kg), last menstrual period 05/02/2024, SpO2 100%. Body mass index is 34.02 kg/m. General: Alert, oriented, no acute distress.  HEENT: Normocephalic, atraumatic. Sclera anicteric.  Chest: Clear to auscultation bilaterally. No wheezes, rhonchi, or rales. Cardiovascular: Regular rate and rhythm, no murmurs, rubs, or gallops.  Abdomen: Obese. Normoactive bowel sounds. Soft, nondistended, nontender to palpation. No masses or hepatosplenomegaly appreciated. No palpable fluid wave.  Extremities: Grossly normal range of motion. Warm, well perfused. No edema bilaterally.  Skin: No rashes or lesions.  GU: Declined.  LABORATORY AND RADIOLOGIC DATA:  Outside medical records were reviewed to synthesize the above history, along with the history and physical obtained during the visit.   Lab Results  Component Value Date   WBC 9.5 04/13/2024   HGB 11.0 (L) 04/13/2024   HCT 35.0 (L) 04/13/2024   PLT 311 04/13/2024   GLUCOSE 107 (H) 04/13/2024   ALT 16 04/13/2024   AST 15 04/13/2024   NA 138 04/13/2024   K 4.3 04/13/2024   CL 110 04/13/2024   CREATININE 0.78 04/13/2024   BUN 15 04/13/2024   CO2 21 (L) 04/13/2024   TSH 0.561 12/29/2023       [1] No Known Allergies

## 2024-06-11 ENCOUNTER — Inpatient Hospital Stay: Admitting: Gynecologic Oncology

## 2024-06-11 ENCOUNTER — Encounter: Payer: Self-pay | Admitting: Gynecologic Oncology

## 2024-06-11 VITALS — BP 107/70 | HR 100 | Temp 98.4°F | Resp 19 | Ht 64.0 in | Wt 198.2 lb

## 2024-06-11 DIAGNOSIS — F17210 Nicotine dependence, cigarettes, uncomplicated: Secondary | ICD-10-CM

## 2024-06-11 DIAGNOSIS — Z720 Tobacco use: Secondary | ICD-10-CM

## 2024-06-11 DIAGNOSIS — N809 Endometriosis, unspecified: Secondary | ICD-10-CM | POA: Diagnosis not present

## 2024-06-11 DIAGNOSIS — R102 Pelvic and perineal pain unspecified side: Secondary | ICD-10-CM

## 2024-06-11 DIAGNOSIS — N920 Excessive and frequent menstruation with regular cycle: Secondary | ICD-10-CM | POA: Diagnosis not present

## 2024-06-11 DIAGNOSIS — N80123 Deep endometriosis of bilateral ovaries: Secondary | ICD-10-CM | POA: Diagnosis not present

## 2024-06-11 DIAGNOSIS — N946 Dysmenorrhea, unspecified: Secondary | ICD-10-CM

## 2024-06-11 NOTE — Patient Instructions (Signed)
 It was very nice to meet you today.  We discussed your diagnosis of endometriosis and why this causes the symptoms it does such as your painful periods.  The initial treatment for endometriosis is medical treatment, using different types of medication to see if we can suppress the hormones that drive the pain and other symptoms that endometriosis causes.  If we cannot control someone symptoms, and we usually try multiple different medication regimens to do this, then we sometimes talk about surgery, which in someone your age would typically involve removal of uterus, cervix, fallopian tubes, and consideration of removing both ovaries.  Please keep me posted in terms of how you are doing.  If we are moving towards surgery at some point in the future, we also discussed today that I would want you to get a colonoscopy and work on cutting back or quitting smoking.
